# Patient Record
Sex: Male | Born: 1975 | Race: White | Hispanic: No | Marital: Married | Smoking: Former smoker
Health system: Southern US, Community
[De-identification: ages and names within clinical notes are randomized; demographics above are authoritative.]

## PROBLEM LIST (undated history)

## (undated) DIAGNOSIS — J449 Chronic obstructive pulmonary disease, unspecified: Secondary | ICD-10-CM

## (undated) DIAGNOSIS — F4310 Post-traumatic stress disorder, unspecified: Secondary | ICD-10-CM

---

## 2000-02-15 ENCOUNTER — Emergency Department (HOSPITAL_COMMUNITY): Admission: EM | Admit: 2000-02-15 | Discharge: 2000-02-15 | Payer: Self-pay | Admitting: Emergency Medicine

## 2005-02-19 DIAGNOSIS — J9383 Other pneumothorax: Secondary | ICD-10-CM

## 2005-02-19 HISTORY — DX: Other pneumothorax: J93.83

## 2005-04-04 ENCOUNTER — Inpatient Hospital Stay (HOSPITAL_COMMUNITY): Admission: EM | Admit: 2005-04-04 | Discharge: 2005-04-10 | Payer: Self-pay | Admitting: Emergency Medicine

## 2005-04-21 ENCOUNTER — Encounter: Admission: RE | Admit: 2005-04-21 | Discharge: 2005-04-21 | Payer: Self-pay | Admitting: Thoracic Surgery

## 2007-09-21 ENCOUNTER — Emergency Department (HOSPITAL_COMMUNITY): Admission: EM | Admit: 2007-09-21 | Discharge: 2007-09-21 | Payer: Self-pay | Admitting: Emergency Medicine

## 2010-12-12 ENCOUNTER — Encounter: Payer: Self-pay | Admitting: Thoracic Surgery
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
## 2019-09-13 ENCOUNTER — Other Ambulatory Visit: Payer: Self-pay

## 2019-09-13 DIAGNOSIS — Z20822 Contact with and (suspected) exposure to covid-19: Secondary | ICD-10-CM

## 2019-09-14 LAB — NOVEL CORONAVIRUS, NAA: SARS-CoV-2, NAA: NOT DETECTED

## 2020-08-14 ENCOUNTER — Other Ambulatory Visit: Payer: Self-pay

## 2020-08-14 DIAGNOSIS — Z20822 Contact with and (suspected) exposure to covid-19: Secondary | ICD-10-CM

## 2020-08-16 LAB — NOVEL CORONAVIRUS, NAA: SARS-CoV-2, NAA: NOT DETECTED

## 2020-08-16 LAB — SARS-COV-2, NAA 2 DAY TAT

## 2020-11-03 DIAGNOSIS — Z23 Encounter for immunization: Secondary | ICD-10-CM | POA: Diagnosis not present

## 2021-10-24 ENCOUNTER — Observation Stay (HOSPITAL_COMMUNITY)
Admission: EM | Admit: 2021-10-24 | Discharge: 2021-10-25 | Disposition: A | Discharge status: Home / Self Care | Payer: BC Managed Care – PPO | Attending: General Surgery | Admitting: General Surgery

## 2021-10-24 ENCOUNTER — Inpatient Hospital Stay (HOSPITAL_COMMUNITY): Payer: BC Managed Care – PPO

## 2021-10-24 ENCOUNTER — Other Ambulatory Visit: Payer: Self-pay

## 2021-10-24 ENCOUNTER — Encounter (HOSPITAL_COMMUNITY): Payer: Self-pay | Admitting: Surgery

## 2021-10-24 ENCOUNTER — Emergency Department (HOSPITAL_COMMUNITY): Payer: BC Managed Care – PPO

## 2021-10-24 DIAGNOSIS — M47812 Spondylosis without myelopathy or radiculopathy, cervical region: Secondary | ICD-10-CM | POA: Diagnosis not present

## 2021-10-24 DIAGNOSIS — R001 Bradycardia, unspecified: Secondary | ICD-10-CM | POA: Insufficient documentation

## 2021-10-24 DIAGNOSIS — S82892C Other fracture of left lower leg, initial encounter for open fracture type IIIA, IIIB, or IIIC: Secondary | ICD-10-CM | POA: Diagnosis not present

## 2021-10-24 DIAGNOSIS — J9811 Atelectasis: Secondary | ICD-10-CM | POA: Diagnosis not present

## 2021-10-24 DIAGNOSIS — S82452D Displaced comminuted fracture of shaft of left fibula, subsequent encounter for closed fracture with routine healing: Secondary | ICD-10-CM | POA: Diagnosis not present

## 2021-10-24 DIAGNOSIS — Z9889 Other specified postprocedural states: Secondary | ICD-10-CM

## 2021-10-24 DIAGNOSIS — S82852A Displaced trimalleolar fracture of left lower leg, initial encounter for closed fracture: Secondary | ICD-10-CM | POA: Diagnosis not present

## 2021-10-24 DIAGNOSIS — S99912A Unspecified injury of left ankle, initial encounter: Secondary | ICD-10-CM | POA: Diagnosis not present

## 2021-10-24 DIAGNOSIS — S82202B Unspecified fracture of shaft of left tibia, initial encounter for open fracture type I or II: Secondary | ICD-10-CM | POA: Diagnosis not present

## 2021-10-24 DIAGNOSIS — S3282XA Multiple fractures of pelvis without disruption of pelvic ring, initial encounter for closed fracture: Secondary | ICD-10-CM | POA: Diagnosis not present

## 2021-10-24 DIAGNOSIS — S82892A Other fracture of left lower leg, initial encounter for closed fracture: Secondary | ICD-10-CM | POA: Diagnosis not present

## 2021-10-24 DIAGNOSIS — I959 Hypotension, unspecified: Secondary | ICD-10-CM | POA: Diagnosis not present

## 2021-10-24 DIAGNOSIS — Z041 Encounter for examination and observation following transport accident: Secondary | ICD-10-CM | POA: Diagnosis not present

## 2021-10-24 DIAGNOSIS — Z23 Encounter for immunization: Secondary | ICD-10-CM | POA: Insufficient documentation

## 2021-10-24 DIAGNOSIS — Z20822 Contact with and (suspected) exposure to covid-19: Secondary | ICD-10-CM | POA: Diagnosis not present

## 2021-10-24 DIAGNOSIS — M25551 Pain in right hip: Secondary | ICD-10-CM | POA: Insufficient documentation

## 2021-10-24 DIAGNOSIS — T07XXXA Unspecified multiple injuries, initial encounter: Secondary | ICD-10-CM | POA: Diagnosis not present

## 2021-10-24 DIAGNOSIS — S82892B Other fracture of left lower leg, initial encounter for open fracture type I or II: Secondary | ICD-10-CM | POA: Diagnosis not present

## 2021-10-24 DIAGNOSIS — Z87891 Personal history of nicotine dependence: Secondary | ICD-10-CM | POA: Insufficient documentation

## 2021-10-24 DIAGNOSIS — S82842C Displaced bimalleolar fracture of left lower leg, initial encounter for open fracture type IIIA, IIIB, or IIIC: Secondary | ICD-10-CM | POA: Diagnosis not present

## 2021-10-24 DIAGNOSIS — S0181XA Laceration without foreign body of other part of head, initial encounter: Secondary | ICD-10-CM | POA: Insufficient documentation

## 2021-10-24 DIAGNOSIS — S3991XA Unspecified injury of abdomen, initial encounter: Secondary | ICD-10-CM | POA: Diagnosis not present

## 2021-10-24 DIAGNOSIS — S82452A Displaced comminuted fracture of shaft of left fibula, initial encounter for closed fracture: Secondary | ICD-10-CM | POA: Diagnosis not present

## 2021-10-24 DIAGNOSIS — S82432A Displaced oblique fracture of shaft of left fibula, initial encounter for closed fracture: Secondary | ICD-10-CM | POA: Diagnosis not present

## 2021-10-24 DIAGNOSIS — S9305XA Dislocation of left ankle joint, initial encounter: Secondary | ICD-10-CM | POA: Diagnosis not present

## 2021-10-24 DIAGNOSIS — R519 Headache, unspecified: Secondary | ICD-10-CM | POA: Diagnosis not present

## 2021-10-24 DIAGNOSIS — S0993XA Unspecified injury of face, initial encounter: Secondary | ICD-10-CM

## 2021-10-24 DIAGNOSIS — S82402B Unspecified fracture of shaft of left fibula, initial encounter for open fracture type I or II: Secondary | ICD-10-CM | POA: Diagnosis not present

## 2021-10-24 DIAGNOSIS — S0990XA Unspecified injury of head, initial encounter: Secondary | ICD-10-CM | POA: Diagnosis not present

## 2021-10-24 DIAGNOSIS — T1490XA Injury, unspecified, initial encounter: Secondary | ICD-10-CM

## 2021-10-24 DIAGNOSIS — J439 Emphysema, unspecified: Secondary | ICD-10-CM | POA: Diagnosis not present

## 2021-10-24 DIAGNOSIS — Z01818 Encounter for other preprocedural examination: Secondary | ICD-10-CM | POA: Diagnosis not present

## 2021-10-24 DIAGNOSIS — S82832A Other fracture of upper and lower end of left fibula, initial encounter for closed fracture: Secondary | ICD-10-CM | POA: Diagnosis not present

## 2021-10-24 LAB — URINALYSIS, ROUTINE W REFLEX MICROSCOPIC
Bilirubin Urine: NEGATIVE
Glucose, UA: NEGATIVE mg/dL
Hgb urine dipstick: NEGATIVE
Ketones, ur: NEGATIVE mg/dL
Leukocytes,Ua: NEGATIVE
Nitrite: NEGATIVE
Protein, ur: NEGATIVE mg/dL
Specific Gravity, Urine: 1.045 — ABNORMAL HIGH (ref 1.005–1.030)
pH: 5 (ref 5.0–8.0)

## 2021-10-24 LAB — SAMPLE TO BLOOD BANK

## 2021-10-24 LAB — I-STAT CHEM 8, ED
BUN: 15 mg/dL (ref 6–20)
Calcium, Ion: 1.14 mmol/L — ABNORMAL LOW (ref 1.15–1.40)
Chloride: 101 mmol/L (ref 98–111)
Creatinine, Ser: 1.2 mg/dL (ref 0.61–1.24)
Glucose, Bld: 121 mg/dL — ABNORMAL HIGH (ref 70–99)
HCT: 43 % (ref 39.0–52.0)
Hemoglobin: 14.6 g/dL (ref 13.0–17.0)
Potassium: 3.7 mmol/L (ref 3.5–5.1)
Sodium: 140 mmol/L (ref 135–145)
TCO2: 29 mmol/L (ref 22–32)

## 2021-10-24 LAB — RESP PANEL BY RT-PCR (FLU A&B, COVID) ARPGX2
Influenza A by PCR: POSITIVE — AB
Influenza B by PCR: NEGATIVE
SARS Coronavirus 2 by RT PCR: NEGATIVE

## 2021-10-24 LAB — COMPREHENSIVE METABOLIC PANEL
ALT: 17 U/L (ref 0–44)
AST: 19 U/L (ref 15–41)
Albumin: 4 g/dL (ref 3.5–5.0)
Alkaline Phosphatase: 52 U/L (ref 38–126)
Anion gap: 7 (ref 5–15)
BUN: 13 mg/dL (ref 6–20)
CO2: 28 mmol/L (ref 22–32)
Calcium: 8.9 mg/dL (ref 8.9–10.3)
Chloride: 104 mmol/L (ref 98–111)
Creatinine, Ser: 1.12 mg/dL (ref 0.61–1.24)
GFR, Estimated: >60 mL/min (ref >60)
Glucose, Bld: 130 mg/dL — ABNORMAL HIGH (ref 70–99)
Potassium: 3.7 mmol/L (ref 3.5–5.1)
Sodium: 139 mmol/L (ref 135–145)
Total Bilirubin: 0.8 mg/dL (ref 0.3–1.2)
Total Protein: 6.8 g/dL (ref 6.5–8.1)

## 2021-10-24 LAB — CBC
HCT: 43.5 % (ref 39.0–52.0)
Hemoglobin: 14.5 g/dL (ref 13.0–17.0)
MCH: 31.9 pg (ref 26.0–34.0)
MCHC: 33.3 g/dL (ref 30.0–36.0)
MCV: 95.8 fL (ref 80.0–100.0)
Platelets: 239 10*3/uL (ref 150–400)
RBC: 4.54 MIL/uL (ref 4.22–5.81)
RDW: 11.5 % (ref 11.5–15.5)
WBC: 8.2 10*3/uL (ref 4.0–10.5)
nRBC: 0 % (ref 0.0–0.2)

## 2021-10-24 LAB — HIV ANTIBODY (ROUTINE TESTING W REFLEX): HIV Screen 4th Generation wRfx: NONREACTIVE

## 2021-10-24 LAB — PROTIME-INR
INR: 1 (ref 0.8–1.2)
Prothrombin Time: 13.2 seconds (ref 11.4–15.2)

## 2021-10-24 LAB — LACTIC ACID, PLASMA: Lactic Acid, Venous: 1.7 mmol/L (ref 0.5–1.9)

## 2021-10-24 LAB — ETHANOL: Alcohol, Ethyl (B): <10 mg/dL (ref <10)

## 2021-10-24 MED ORDER — LACTATED RINGERS IV SOLN: INTRAVENOUS | Status: DC

## 2021-10-24 MED ORDER — DOCUSATE SODIUM 100 MG PO CAPS
100.0000 mg | ORAL_CAPSULE | Freq: Two times a day (BID) | ORAL | Status: DC
  Administered 2021-10-24: 22:00:00 100 mg via ORAL
  Filled 2021-10-24: qty 1

## 2021-10-24 MED ORDER — PROPOFOL 10 MG/ML IV BOLUS
INTRAVENOUS | Status: AC | PRN
  Administered 2021-10-24: 60 mg via INTRAVENOUS

## 2021-10-24 MED ORDER — ONDANSETRON 4 MG PO TBDP: 4.0000 mg | ORAL_TABLET | Freq: Four times a day (QID) | ORAL | Status: DC | PRN

## 2021-10-24 MED ORDER — SODIUM CHLORIDE 0.9 % IV SOLN: INTRAVENOUS | Status: DC

## 2021-10-24 MED ORDER — LIDOCAINE HCL 2 % IJ SOLN: 10.0000 mL | Freq: Once | INTRAMUSCULAR | Status: DC

## 2021-10-24 MED ORDER — LIDOCAINE HCL (PF) 2 % IJ SOLN: 10.0000 mL | Freq: Once | INTRAMUSCULAR | Status: DC

## 2021-10-24 MED ORDER — FENTANYL CITRATE PF 50 MCG/ML IJ SOSY: 50.0000 ug | PREFILLED_SYRINGE | Freq: Once | INTRAMUSCULAR | Status: DC

## 2021-10-24 MED ORDER — ACETAMINOPHEN 325 MG PO TABS
650.0000 mg | ORAL_TABLET | Freq: Four times a day (QID) | ORAL | Status: DC
  Administered 2021-10-24 – 2021-10-25 (×3): 650 mg via ORAL
  Filled 2021-10-24 (×3): qty 2

## 2021-10-24 MED ORDER — LIDOCAINE HCL (CARDIAC) PF 100 MG/5ML IV SOSY
PREFILLED_SYRINGE | INTRAVENOUS | Status: AC
  Filled 2021-10-24: qty 5

## 2021-10-24 MED ORDER — METHOCARBAMOL 500 MG PO TABS
500.0000 mg | ORAL_TABLET | Freq: Four times a day (QID) | ORAL | Status: DC
  Administered 2021-10-24 – 2021-10-25 (×3): 500 mg via ORAL
  Filled 2021-10-24 (×3): qty 1

## 2021-10-24 MED ORDER — HYDROMORPHONE HCL 1 MG/ML IJ SOLN
0.5000 mg | INTRAMUSCULAR | Status: DC | PRN
  Administered 2021-10-24 – 2021-10-25 (×4): 0.5 mg via INTRAVENOUS
  Filled 2021-10-24 (×4): qty 0.5

## 2021-10-24 MED ORDER — IOHEXOL 300 MG/ML  SOLN
100.0000 mL | Freq: Once | INTRAMUSCULAR | Status: AC | PRN
  Administered 2021-10-24: 18:00:00 100 mL via INTRAVENOUS

## 2021-10-24 MED ORDER — TETANUS-DIPHTH-ACELL PERTUSSIS 5-2.5-18.5 LF-MCG/0.5 IM SUSY
0.5000 mL | PREFILLED_SYRINGE | Freq: Once | INTRAMUSCULAR | Status: AC
  Administered 2021-10-24: 17:00:00 0.5 mL via INTRAMUSCULAR
  Filled 2021-10-24: qty 0.5

## 2021-10-24 MED ORDER — ENOXAPARIN SODIUM 30 MG/0.3ML IJ SOSY: 30.0000 mg | PREFILLED_SYRINGE | Freq: Two times a day (BID) | INTRAMUSCULAR | Status: DC

## 2021-10-24 MED ORDER — CEFAZOLIN SODIUM-DEXTROSE 2-4 GM/100ML-% IV SOLN
2.0000 g | Freq: Once | INTRAVENOUS | Status: AC
  Administered 2021-10-24: 17:00:00 2 g via INTRAVENOUS
  Filled 2021-10-24: qty 100

## 2021-10-24 MED ORDER — OSELTAMIVIR PHOSPHATE 75 MG PO CAPS
75.0000 mg | ORAL_CAPSULE | Freq: Two times a day (BID) | ORAL | Status: DC
  Administered 2021-10-24 – 2021-10-25 (×2): 75 mg via ORAL
  Filled 2021-10-24 (×3): qty 1

## 2021-10-24 MED ORDER — SODIUM CHLORIDE 0.9 % IV BOLUS
1000.0000 mL | Freq: Once | INTRAVENOUS | Status: AC
  Administered 2021-10-24: 17:00:00 1000 mL via INTRAVENOUS

## 2021-10-24 MED ORDER — OXYCODONE HCL 5 MG PO TABS
5.0000 mg | ORAL_TABLET | ORAL | Status: DC | PRN
  Administered 2021-10-24 – 2021-10-25 (×4): 5 mg via ORAL
  Filled 2021-10-24 (×4): qty 1

## 2021-10-24 MED ORDER — PROPOFOL 10 MG/ML IV BOLUS
60.0000 mg | Freq: Once | INTRAVENOUS | Status: DC
  Filled 2021-10-24: qty 20

## 2021-10-24 MED ORDER — FENTANYL CITRATE PF 50 MCG/ML IJ SOSY
100.0000 ug | PREFILLED_SYRINGE | INTRAMUSCULAR | Status: DC | PRN
  Administered 2021-10-24: 17:00:00 100 ug via INTRAVENOUS
  Filled 2021-10-24: qty 2

## 2021-10-24 MED ORDER — ONDANSETRON HCL 4 MG/2ML IJ SOLN: 4.0000 mg | Freq: Four times a day (QID) | INTRAMUSCULAR | Status: DC | PRN

## 2021-10-24 MED ORDER — LIDOCAINE HCL (PF) 1 % IJ SOLN
INTRAMUSCULAR | Status: AC
  Filled 2021-10-24: qty 30
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
## 2021-10-24 NOTE — Sedation Documentation (Signed)
Consent signed by pt. Consent at bedside.

## 2021-10-24 NOTE — ED Notes (Signed)
Belongings bagged and labeled, pt using his cell phone. Feet elevated above heart. Pillow given.

## 2021-10-24 NOTE — Sedation Documentation (Signed)
Ortho and RT at bedside. Pt placed on 2L via [place] with ETCO2 monitor in place.

## 2021-10-24 NOTE — ED Notes (Signed)
Patient transported to CT 
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
## 2021-10-24 NOTE — ED Notes (Signed)
Pt transported to CT with Clydie Braun, [employer] CT delayed per Jeraldine Loots, MD due to priority for conscious sedation.

## 2021-10-24 NOTE — Progress Notes (Signed)
Orthopedic Tech Progress Note Patient Details:  Jake Morgan 08-Aug-1976 [id number]  Ortho Devices Type of Ortho Device: Post (short leg) splint, Stirrup splint Ortho Device/Splint Location: plaster. lle Ortho Device/Splint Interventions: Ordered, Application, Adjustment  I assisted ortho dr with splint application post reduction. Post Interventions Patient Tolerated: Well Instructions Provided: Care of device, Adjustment of device  [name] 10/24/2021, 8:03 PM
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
## 2021-10-25 ENCOUNTER — Other Ambulatory Visit (HOSPITAL_COMMUNITY): Payer: Self-pay

## 2021-10-25 DIAGNOSIS — S82202B Unspecified fracture of shaft of left tibia, initial encounter for open fracture type I or II: Secondary | ICD-10-CM | POA: Diagnosis not present

## 2021-10-25 DIAGNOSIS — S82402B Unspecified fracture of shaft of left fibula, initial encounter for open fracture type I or II: Secondary | ICD-10-CM | POA: Diagnosis not present

## 2021-10-25 MED ORDER — POLYETHYLENE GLYCOL 3350 17 G PO PACK: 17.0000 g | PACK | Freq: Every day | ORAL | 0 refills | Status: DC | PRN

## 2021-10-25 MED ORDER — ENOXAPARIN SODIUM 30 MG/0.3ML IJ SOSY
30.0000 mg | PREFILLED_SYRINGE | Freq: Two times a day (BID) | INTRAMUSCULAR | 0 refills | Status: DC
Start: 2021-10-25 — End: 2021-11-04
  Filled 2021-10-25: qty 8.4, 14d supply, fill #0

## 2021-10-25 MED ORDER — METHOCARBAMOL 500 MG PO TABS
500.0000 mg | ORAL_TABLET | Freq: Four times a day (QID) | ORAL | 0 refills | Status: DC | PRN
  Filled 2021-10-25: qty 30, 8d supply, fill #0

## 2021-10-25 MED ORDER — HYDROXYZINE HCL 25 MG PO TABS
25.0000 mg | ORAL_TABLET | Freq: Three times a day (TID) | ORAL | 0 refills | Status: DC | PRN
Start: 2021-10-25 — End: 2023-06-27
  Filled 2021-10-25: qty 20, 4d supply, fill #0

## 2021-10-25 MED ORDER — HYDROXYZINE HCL 25 MG PO TABS
25.0000 mg | ORAL_TABLET | Freq: Three times a day (TID) | ORAL | Status: DC | PRN
  Administered 2021-10-25: 50 mg via ORAL
  Filled 2021-10-25: qty 2

## 2021-10-25 MED ORDER — OSELTAMIVIR PHOSPHATE 75 MG PO CAPS
75.0000 mg | ORAL_CAPSULE | Freq: Two times a day (BID) | ORAL | 0 refills | Status: AC
  Filled 2021-10-25: qty 8, 4d supply, fill #0

## 2021-10-25 MED ORDER — OXYCODONE HCL 5 MG PO TABS
5.0000 mg | ORAL_TABLET | Freq: Four times a day (QID) | ORAL | 0 refills | Status: DC | PRN
  Filled 2021-10-25: qty 25, 7d supply, fill #0

## 2021-10-25 NOTE — Progress Notes (Signed)
     Subjective: Patient reports left ankle pain is well controlled today.  He denies distal numbness and tingling.  He denies pain in other joints or extremities.  He denies any chest pain or shortness of breath.  He is n.p.o. for possible surgery but discussion with OR, there is limited availability.   Objective:   VITALS:   Vitals:   10/24/21 2000 10/24/21 2043 10/24/21 2335 10/25/21 0404  BP: 128/87 126/75 105/60 (!) 104/55  Pulse: 71 78 68 (!) 58  Resp: 20 18 18 17   Temp:  98.9 F (37.2 C) 98.1 F (36.7 C) 98.2 F (36.8 C)  TempSrc:   Oral Oral  SpO2: 99% 100% 100% 98%  Weight:      Height:        Splint clean, dry and intact. Fires EHL, FHL. Sensation intact distally in exposed toes. Toes are warm and well perfused, no significant edema.   Lab Results  Component Value Date   WBC 8.2 10/24/2021   HGB 14.6 10/24/2021   HCT 43.0 10/24/2021   MCV 95.8 10/24/2021   PLT 239 10/24/2021   BMET    Component Value Date/Time   NA 140 10/24/2021 1703   K 3.7 10/24/2021 1703   CL 101 10/24/2021 1703   CO2 28 10/24/2021 1649   GLUCOSE 121 (H) 10/24/2021 1703   BUN 15 10/24/2021 1703   CREATININE 1.20 10/24/2021 1703   CALCIUM 8.9 10/24/2021 1649   GFRNONAA >60 10/24/2021 1649     Assessment/Plan:    Left closed trimalleolar ankle fracture.  There was concern initially for possible open fracture given abrasions over the medial mall.  Upon reassessment during repeat closed reduction the wounds were found to be superficial, without significant bleeding or full-thickness breakdown.  CT scan shows large comminuted posterior malleolar fragment.  Given complexity of the injury will defer surgical management to one of the trauma or ankle specialist.  Discussed with Dr. 14/02/2021 who is in trauma room today but there is unfortunately limited OR availability.  If unable to proceed to the OR today we will give patient a diet.  Patient expressed understanding agreement this  plan.      Vee Bahe A Rolland Steinert 10/25/2021, 8:48 AM   14/03/2021, MD  Contact information:   [phone] 7am-5pm epic message Dr. [email or web address], or call office for patient follow up: [phone] After hours and holidays please check [email or web address] for group call information for [facility]
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
## 2021-10-25 NOTE — Consult Note (Signed)
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  Cardiovascular:     Rate and Rhythm: Normal rate and regular rhythm.  Pulmonary:     Effort: Pulmonary effort is normal. No respiratory distress.  Musculoskeletal:     Cervical back: Normal range of motion.     Comments: LLE No traumatic wounds, ecchymosis, or rash  Short leg splint in place  No knee effusion  Knee stable to varus/ valgus and anterior/posterior stress  Sens DPN, SPN, TN intact  Motor EHL 5/5  Toes perfused, No significant edema  Skin:    General: Skin is warm and dry.  Neurological:     Mental Status: He is alert.  Psychiatric:        Mood and Affect: Mood normal.        Behavior: Behavior normal.    Assessment/Plan: Left ankle fx -- Will try to fix today if OR time available. Please keep NPO for now.    Freeman Caldron, PA-C Orthopedic Surgery [phone] 10/25/2021, 9:08 AM
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
## 2021-10-28 DIAGNOSIS — S82852D Displaced trimalleolar fracture of left lower leg, subsequent encounter for closed fracture with routine healing: Secondary | ICD-10-CM | POA: Diagnosis not present

## 2021-10-28 DIAGNOSIS — S0181XD Laceration without foreign body of other part of head, subsequent encounter: Secondary | ICD-10-CM | POA: Diagnosis not present

## 2021-11-03 ENCOUNTER — Encounter (HOSPITAL_COMMUNITY): Payer: Self-pay | Admitting: Orthopaedic Surgery

## 2021-11-03 ENCOUNTER — Other Ambulatory Visit: Payer: Self-pay | Admitting: Orthopaedic Surgery

## 2021-11-04 ENCOUNTER — Other Ambulatory Visit: Payer: Self-pay

## 2021-11-04 ENCOUNTER — Ambulatory Visit (HOSPITAL_COMMUNITY): Payer: BC Managed Care – PPO

## 2021-11-04 ENCOUNTER — Ambulatory Visit (HOSPITAL_COMMUNITY): Payer: BC Managed Care – PPO | Admitting: Anesthesiology

## 2021-11-04 ENCOUNTER — Ambulatory Visit (HOSPITAL_COMMUNITY)
Admission: RE | Admit: 2021-11-04 | Discharge: 2021-11-04 | Disposition: A | Discharge status: Home / Self Care | Payer: BC Managed Care – PPO | Attending: Orthopaedic Surgery | Admitting: Orthopaedic Surgery

## 2021-11-04 ENCOUNTER — Encounter (HOSPITAL_COMMUNITY): Payer: Self-pay | Admitting: Orthopaedic Surgery

## 2021-11-04 ENCOUNTER — Encounter (HOSPITAL_COMMUNITY)
Admission: RE | Disposition: A | Discharge status: Home / Self Care | Payer: Self-pay | Source: Home / Self Care | Attending: Orthopaedic Surgery

## 2021-11-04 DIAGNOSIS — Y939 Activity, unspecified: Secondary | ICD-10-CM | POA: Diagnosis not present

## 2021-11-04 DIAGNOSIS — X58XXXA Exposure to other specified factors, initial encounter: Secondary | ICD-10-CM | POA: Insufficient documentation

## 2021-11-04 DIAGNOSIS — Z419 Encounter for procedure for purposes other than remedying health state, unspecified: Secondary | ICD-10-CM

## 2021-11-04 DIAGNOSIS — Z87891 Personal history of nicotine dependence: Secondary | ICD-10-CM | POA: Diagnosis not present

## 2021-11-04 DIAGNOSIS — S93432A Sprain of tibiofibular ligament of left ankle, initial encounter: Secondary | ICD-10-CM | POA: Insufficient documentation

## 2021-11-04 DIAGNOSIS — S9305XA Dislocation of left ankle joint, initial encounter: Secondary | ICD-10-CM | POA: Diagnosis not present

## 2021-11-04 DIAGNOSIS — S82852A Displaced trimalleolar fracture of left lower leg, initial encounter for closed fracture: Secondary | ICD-10-CM | POA: Diagnosis not present

## 2021-11-04 DIAGNOSIS — G8918 Other acute postprocedural pain: Secondary | ICD-10-CM | POA: Diagnosis not present

## 2021-11-04 HISTORY — DX: Post-traumatic stress disorder, unspecified: F43.10

## 2021-11-04 HISTORY — PX: ORIF ANKLE FRACTURE: SHX5408

## 2021-11-04 LAB — SURGICAL PCR SCREEN
MRSA, PCR: NEGATIVE
Staphylococcus aureus: NEGATIVE

## 2021-11-04 SURGERY — OPEN REDUCTION INTERNAL FIXATION (ORIF) ANKLE FRACTURE
Anesthesia: Regional | Site: Ankle | Laterality: Left

## 2021-11-04 MED ORDER — PHENYLEPHRINE 40 MCG/ML (10ML) SYRINGE FOR IV PUSH (FOR BLOOD PRESSURE SUPPORT)
PREFILLED_SYRINGE | INTRAVENOUS | Status: DC | PRN
  Administered 2021-11-04: 120 ug via INTRAVENOUS

## 2021-11-04 MED ORDER — FENTANYL CITRATE (PF) 250 MCG/5ML IJ SOLN
INTRAMUSCULAR | Status: DC | PRN
  Administered 2021-11-04: 25 ug via INTRAVENOUS
  Administered 2021-11-04: 100 ug via INTRAVENOUS
  Administered 2021-11-04: 25 ug via INTRAVENOUS

## 2021-11-04 MED ORDER — MIDAZOLAM HCL 2 MG/2ML IJ SOLN
INTRAMUSCULAR | Status: AC
  Filled 2021-11-04: qty 2

## 2021-11-04 MED ORDER — PROPOFOL 10 MG/ML IV BOLUS
INTRAVENOUS | Status: DC | PRN
  Administered 2021-11-04: 200 mg via INTRAVENOUS

## 2021-11-04 MED ORDER — OXYCODONE HCL 5 MG PO TABS: 5.0000 mg | ORAL_TABLET | ORAL | 0 refills | Status: AC | PRN

## 2021-11-04 MED ORDER — DEXMEDETOMIDINE (PRECEDEX) IN NS 20 MCG/5ML (4 MCG/ML) IV SYRINGE
PREFILLED_SYRINGE | INTRAVENOUS | Status: AC
  Filled 2021-11-04: qty 5

## 2021-11-04 MED ORDER — DEXAMETHASONE SODIUM PHOSPHATE 10 MG/ML IJ SOLN
INTRAMUSCULAR | Status: DC | PRN
  Administered 2021-11-04 (×2): 5 mg

## 2021-11-04 MED ORDER — ACETAMINOPHEN 500 MG PO TABS
1000.0000 mg | ORAL_TABLET | Freq: Once | ORAL | Status: DC
  Filled 2021-11-04: qty 2

## 2021-11-04 MED ORDER — CHLORHEXIDINE GLUCONATE 4 % EX LIQD: 60.0000 mL | Freq: Once | CUTANEOUS | Status: DC

## 2021-11-04 MED ORDER — DEXMEDETOMIDINE (PRECEDEX) IN NS 20 MCG/5ML (4 MCG/ML) IV SYRINGE
PREFILLED_SYRINGE | INTRAVENOUS | Status: DC | PRN
  Administered 2021-11-04: 8 ug via INTRAVENOUS

## 2021-11-04 MED ORDER — MIDAZOLAM HCL 2 MG/2ML IJ SOLN
INTRAMUSCULAR | Status: DC | PRN
  Administered 2021-11-04: 2 mg via INTRAVENOUS

## 2021-11-04 MED ORDER — ACETAMINOPHEN 10 MG/ML IV SOLN
INTRAVENOUS | Status: DC | PRN
  Administered 2021-11-04: 1000 mg via INTRAVENOUS

## 2021-11-04 MED ORDER — DEXAMETHASONE SODIUM PHOSPHATE 10 MG/ML IJ SOLN
INTRAMUSCULAR | Status: DC | PRN
  Administered 2021-11-04: 5 mg via INTRAVENOUS

## 2021-11-04 MED ORDER — LIDOCAINE 2% (20 MG/ML) 5 ML SYRINGE
INTRAMUSCULAR | Status: DC | PRN
  Administered 2021-11-04: 20 mg via INTRAVENOUS

## 2021-11-04 MED ORDER — POVIDONE-IODINE 10 % EX SWAB
2.0000 "application " | Freq: Once | CUTANEOUS | Status: AC
  Administered 2021-11-04: 2 via TOPICAL

## 2021-11-04 MED ORDER — PROPOFOL 10 MG/ML IV BOLUS
INTRAVENOUS | Status: AC
  Filled 2021-11-04: qty 20

## 2021-11-04 MED ORDER — FENTANYL CITRATE (PF) 250 MCG/5ML IJ SOLN
INTRAMUSCULAR | Status: AC
  Filled 2021-11-04: qty 5

## 2021-11-04 MED ORDER — LIDOCAINE 2% (20 MG/ML) 5 ML SYRINGE
INTRAMUSCULAR | Status: AC
  Filled 2021-11-04: qty 5

## 2021-11-04 MED ORDER — PHENYLEPHRINE 40 MCG/ML (10ML) SYRINGE FOR IV PUSH (FOR BLOOD PRESSURE SUPPORT)
PREFILLED_SYRINGE | INTRAVENOUS | Status: AC
  Filled 2021-11-04: qty 10

## 2021-11-04 MED ORDER — 0.9 % SODIUM CHLORIDE (POUR BTL) OPTIME
TOPICAL | Status: DC | PRN
  Administered 2021-11-04: 500 mL

## 2021-11-04 MED ORDER — ONDANSETRON HCL 4 MG/2ML IJ SOLN
INTRAMUSCULAR | Status: DC | PRN
  Administered 2021-11-04: 4 mg via INTRAVENOUS

## 2021-11-04 MED ORDER — ONDANSETRON HCL 4 MG/2ML IJ SOLN
INTRAMUSCULAR | Status: AC
  Filled 2021-11-04: qty 2

## 2021-11-04 MED ORDER — CEFAZOLIN SODIUM-DEXTROSE 2-4 GM/100ML-% IV SOLN
2.0000 g | INTRAVENOUS | Status: AC
  Administered 2021-11-04: 2 g via INTRAVENOUS
  Filled 2021-11-04: qty 100

## 2021-11-04 MED ORDER — DEXAMETHASONE SODIUM PHOSPHATE 10 MG/ML IJ SOLN
INTRAMUSCULAR | Status: AC
  Filled 2021-11-04: qty 1

## 2021-11-04 MED ORDER — ACETAMINOPHEN 10 MG/ML IV SOLN
INTRAVENOUS | Status: AC
  Filled 2021-11-04: qty 100

## 2021-11-04 MED ORDER — EPHEDRINE SULFATE-NACL 50-0.9 MG/10ML-% IV SOSY
PREFILLED_SYRINGE | INTRAVENOUS | Status: DC | PRN
  Administered 2021-11-04: 5 mg via INTRAVENOUS

## 2021-11-04 MED ORDER — ROPIVACAINE HCL 5 MG/ML IJ SOLN
INTRAMUSCULAR | Status: DC | PRN
  Administered 2021-11-04: 15 mL via PERINEURAL
  Administered 2021-11-04: 30 mL via PERINEURAL

## 2021-11-04 MED ORDER — ORAL CARE MOUTH RINSE: 15.0000 mL | Freq: Once | OROMUCOSAL | Status: AC

## 2021-11-04 MED ORDER — CHLORHEXIDINE GLUCONATE 0.12 % MT SOLN
15.0000 mL | Freq: Once | OROMUCOSAL | Status: AC
  Administered 2021-11-04: 15 mL via OROMUCOSAL
  Filled 2021-11-04: qty 15

## 2021-11-04 MED ORDER — LACTATED RINGERS IV SOLN: INTRAVENOUS | Status: DC

## 2021-11-04 MED ORDER — EPHEDRINE 5 MG/ML INJ
INTRAVENOUS | Status: AC
  Filled 2021-11-04: qty 5

## 2021-11-04 SURGICAL SUPPLY — 71 items
ALCOHOL 70% 16 OZ (MISCELLANEOUS) ×2 IMPLANT
APL PRP STRL LF DISP 70% ISPRP (MISCELLANEOUS) ×2
BAG COUNTER SPONGE SURGICOUNT (BAG) ×1 IMPLANT
BAG SPNG CNTER NS LX DISP (BAG)
BANDAGE ESMARK 6X9 LF (GAUZE/BANDAGES/DRESSINGS) IMPLANT
BIT DRILL 2.5 CANN LNG (BIT) ×1 IMPLANT
BIT DRILL 2.6 CANN (BIT) ×2 IMPLANT
BLADE SURG 15 STRL LF DISP TIS (BLADE) ×1 IMPLANT
BLADE SURG 15 STRL SS (BLADE) ×2
BNDG CMPR 9X6 STRL LF SNTH (GAUZE/BANDAGES/DRESSINGS)
BNDG CMPR MED 10X6 ELC LF (GAUZE/BANDAGES/DRESSINGS)
BNDG COHESIVE 4X5 TAN STRL (GAUZE/BANDAGES/DRESSINGS) IMPLANT
BNDG COHESIVE 6X5 TAN STRL LF (GAUZE/BANDAGES/DRESSINGS) IMPLANT
BNDG ELASTIC 4X5.8 VLCR STR LF (GAUZE/BANDAGES/DRESSINGS) ×1 IMPLANT
BNDG ELASTIC 6X10 VLCR STRL LF (GAUZE/BANDAGES/DRESSINGS) ×1 IMPLANT
BNDG ELASTIC 6X5.8 VLCR STR LF (GAUZE/BANDAGES/DRESSINGS) ×1 IMPLANT
BNDG ESMARK 6X9 LF (GAUZE/BANDAGES/DRESSINGS)
CANISTER SUCT 3000ML PPV (MISCELLANEOUS) ×2 IMPLANT
CHLORAPREP W/TINT 26 (MISCELLANEOUS) ×4 IMPLANT
COVER SURGICAL LIGHT HANDLE (MISCELLANEOUS) ×2 IMPLANT
CUFF TOURN SGL QUICK 34 (TOURNIQUET CUFF) ×2
CUFF TOURN SGL QUICK 42 (TOURNIQUET CUFF) IMPLANT
CUFF TRNQT CYL 34X4.125X (TOURNIQUET CUFF) ×1 IMPLANT
DRAPE C-ARM 42X72 X-RAY (DRAPES) ×1 IMPLANT
DRAPE C-ARMOR (DRAPES) ×1 IMPLANT
DRAPE OEC MINIVIEW 54X84 (DRAPES) ×1 IMPLANT
DRAPE U-SHAPE 47X51 STRL (DRAPES) ×2 IMPLANT
DRSG MEPITEL 4X7.2 (GAUZE/BANDAGES/DRESSINGS) ×1 IMPLANT
DRSG PAD ABDOMINAL 8X10 ST (GAUZE/BANDAGES/DRESSINGS) ×2 IMPLANT
DRSG XEROFORM 1X8 (GAUZE/BANDAGES/DRESSINGS) ×2 IMPLANT
ELECT REM PT RETURN 9FT ADLT (ELECTROSURGICAL) ×2
ELECTRODE REM PT RTRN 9FT ADLT (ELECTROSURGICAL) ×1 IMPLANT
GAUZE SPONGE 4X4 12PLY STRL (GAUZE/BANDAGES/DRESSINGS) IMPLANT
GAUZE SPONGE 4X4 12PLY STRL LF (GAUZE/BANDAGES/DRESSINGS) ×2 IMPLANT
GLOVE SRG 8 PF TXTR STRL LF DI (GLOVE) ×1 IMPLANT
GLOVE SURG ENC TEXT LTX SZ7.5 (GLOVE) ×4 IMPLANT
GLOVE SURG UNDER POLY LF SZ8 (GLOVE) ×4
GOWN STRL REUS W/ TWL LRG LVL3 (GOWN DISPOSABLE) ×1 IMPLANT
GOWN STRL REUS W/ TWL XL LVL3 (GOWN DISPOSABLE) ×1 IMPLANT
GOWN STRL REUS W/TWL LRG LVL3 (GOWN DISPOSABLE) ×2
GOWN STRL REUS W/TWL XL LVL3 (GOWN DISPOSABLE) ×4
GUIDEWIRE 1.35MM (WIRE) ×4 IMPLANT
KIT BASIN OR (CUSTOM PROCEDURE TRAY) ×2 IMPLANT
KIT TURNOVER KIT B (KITS) ×2 IMPLANT
NS IRRIG 1000ML POUR BTL (IV SOLUTION) ×2 IMPLANT
PACK ORTHO EXTREMITY (CUSTOM PROCEDURE TRAY) ×2 IMPLANT
PAD ARMBOARD 7.5X6 YLW CONV (MISCELLANEOUS) ×4 IMPLANT
PAD CAST 4YDX4 CTTN HI CHSV (CAST SUPPLIES) ×1 IMPLANT
PADDING CAST COTTON 4X4 STRL (CAST SUPPLIES) ×2
PLATE LOCK THIRD 150 12H (Plate) ×1 IMPLANT
SCREW CANC TI FT ANKLE 4X18 (Screw) ×1 IMPLANT
SCREW LO-PRO TI 3.5X16MM (Screw) ×3 IMPLANT
SCREW LP TI 3.5X14MM (Screw) ×2 IMPLANT
SCREW QCFIX CANN 4X46 SHT (Screw) ×1 IMPLANT
SCREW QCKFIX CANN 4.0X40MM (Screw) ×2 IMPLANT
SPLINT PLASTER CAST XFAST 5X30 (CAST SUPPLIES) IMPLANT
SPLINT PLASTER XFAST SET 5X30 (CAST SUPPLIES) ×1
SPONGE T-LAP 18X18 ~~LOC~~+RFID (SPONGE) ×2 IMPLANT
STAPLER VISISTAT 35W (STAPLE) ×2 IMPLANT
SUCTION FRAZIER HANDLE 10FR (MISCELLANEOUS) ×2
SUCTION TUBE FRAZIER 10FR DISP (MISCELLANEOUS) ×1 IMPLANT
SUT ETHILON 3 0 PS 1 (SUTURE) ×1 IMPLANT
SUT MNCRL AB 3-0 PS2 18 (SUTURE) ×1 IMPLANT
SUT MON AB 3-0 SH 27 (SUTURE) ×4
SUT MON AB 3-0 SH27 (SUTURE) ×1 IMPLANT
SUT VIC AB 2-0 CT1 27 (SUTURE) ×4
SUT VIC AB 2-0 CT1 TAPERPNT 27 (SUTURE) ×2 IMPLANT
SYNDESMOSIS TIGHTROPE XP (Orthopedic Implant) ×1 IMPLANT
TOWEL GREEN STERILE (TOWEL DISPOSABLE) ×2 IMPLANT
TOWEL GREEN STERILE FF (TOWEL DISPOSABLE) ×2 IMPLANT
TUBE CONNECTING 12X1/4 (SUCTIONS) ×2 IMPLANT

## 2021-11-04 NOTE — Anesthesia Procedure Notes (Signed)
Procedure Name: LMA Insertion Date/Time: 11/04/2021 7:50 AM Performed by: Laruth Bouchard., CRNA Pre-anesthesia Checklist: Patient identified, Emergency Drugs available, Suction available, Patient being monitored and Timeout performed Patient Re-evaluated:Patient Re-evaluated prior to induction Oxygen Delivery Method: Circle system utilized Preoxygenation: Pre-oxygenation with 100% oxygen Induction Type: IV induction LMA: LMA inserted LMA Size: 4.0 Number of attempts: 1 Placement Confirmation: positive ETCO2 and breath sounds checked- equal and bilateral Tube secured with: Tape Dental Injury: Teeth and Oropharynx as per pre-operative assessment

## 2021-11-04 NOTE — Anesthesia Postprocedure Evaluation (Signed)
Anesthesia Post Note  Patient: Gehrig Patras  Procedure(s) Performed: OPEN REDUCTION TRIMALLEOLAR ANKLE FRACTURE WITH POSTERIOR FIXATION, SYNDESMOSIS (Left: Ankle)     Patient location during evaluation: PACU Anesthesia Type: Regional and General Level of consciousness: awake and alert Pain management: pain level controlled Vital Signs Assessment: post-procedure vital signs reviewed and stable Respiratory status: spontaneous breathing, nonlabored ventilation, respiratory function stable and patient connected to nasal cannula oxygen Cardiovascular status: blood pressure returned to baseline and stable Postop Assessment: no apparent nausea or vomiting Anesthetic complications: no   No notable events documented.  Last Vitals:  Vitals:   11/04/21 1023 11/04/21 1038  BP: 115/76 114/81  Pulse: 62 81  Resp: 13 15  Temp:  36.6 C  SpO2: 100% 100%    Last Pain:  Vitals:   11/04/21 1038  TempSrc:   PainSc: 0-No pain                 Levent Kornegay L Daivion Pape

## 2021-11-04 NOTE — Transfer of Care (Signed)
Immediate Anesthesia Transfer of Care Note  Patient: Jake Morgan  Procedure(s) Performed: OPEN REDUCTION TRIMALLEOLAR ANKLE FRACTURE WITH POSTERIOR FIXATION, SYNDESMOSIS (Left: Ankle)  Patient Location: PACU  Anesthesia Type:General and Regional  Level of Consciousness: awake, alert  and oriented  Airway & Oxygen Therapy: Patient Spontanous Breathing and Patient connected to nasal cannula oxygen  Post-op Assessment: Report given to RN and Post -op Vital signs reviewed and stable  Post vital signs: Reviewed and stable  Last Vitals:  Vitals Value Taken Time  BP 134/82 11/04/21 0952  Temp    Pulse 100 11/04/21 0953  Resp 11 11/04/21 0953  SpO2 99 % 11/04/21 0953  Vitals shown include unvalidated device data.  Last Pain:  Vitals:   11/04/21 0554  TempSrc: Oral  PainSc:          Complications: No notable events documented.
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
## 2021-11-04 NOTE — Anesthesia Procedure Notes (Signed)
Anesthesia Regional Block: Adductor canal block   Pre-Anesthetic Checklist: , timeout performed,  Correct Patient, Correct Site, Correct Laterality,  Correct Procedure, Correct Position, site marked,  Risks and benefits discussed,  Surgical consent,  Pre-op evaluation,  At surgeon's request and post-op pain management  Laterality: Left  Prep: Maximum Sterile Barrier Precautions used, chloraprep       Needles:  Injection technique: Single-shot  Needle Type: Echogenic Stimulator Needle     Needle Length: 9cm  Needle Gauge: 22     Additional Needles:   Procedures:,,,, ultrasound used (permanent image in chart),,    Narrative:  Start time: 11/04/2021 7:28 AM End time: 11/04/2021 7:30 AM Injection made incrementally with aspirations every 5 mL.  Performed by: Personally  Anesthesiologist: Elmer Picker, MD  Additional Notes: Monitors applied. No increased pain on injection. No increased resistance to injection. Injection made in 5cc increments. Good needle visualization. Patient tolerated procedure well.

## 2021-11-04 NOTE — Discharge Instructions (Signed)
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
## 2021-11-04 NOTE — Anesthesia Procedure Notes (Signed)
Anesthesia Regional Block: Popliteal block   Pre-Anesthetic Checklist: , timeout performed,  Correct Patient, Correct Site, Correct Laterality,  Correct Procedure, Correct Position, site marked,  Risks and benefits discussed,  Surgical consent,  Pre-op evaluation,  At surgeon's request and post-op pain management  Laterality: Left  Prep: Maximum Sterile Barrier Precautions used, chloraprep       Needles:  Injection technique: Single-shot  Needle Type: Echogenic Stimulator Needle     Needle Length: 9cm  Needle Gauge: 22     Additional Needles:   Procedures:,,,, ultrasound used (permanent image in chart),,    Narrative:  Start time: 11/04/2021 7:22 AM End time: 11/04/2021 7:25 AM Injection made incrementally with aspirations every 5 mL.  Performed by: Personally  Anesthesiologist: Elmer Picker, MD  Additional Notes: Monitors applied. No increased pain on injection. No increased resistance to injection. Injection made in 5cc increments. Good needle visualization. Patient tolerated procedure well.

## 2021-11-05 ENCOUNTER — Other Ambulatory Visit (HOSPITAL_COMMUNITY): Payer: Self-pay

## 2021-11-05 ENCOUNTER — Encounter (HOSPITAL_COMMUNITY): Payer: Self-pay | Admitting: Orthopaedic Surgery
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
## 2021-11-19 DIAGNOSIS — Z9889 Other specified postprocedural states: Secondary | ICD-10-CM | POA: Diagnosis not present

## 2021-11-19 DIAGNOSIS — S82852D Displaced trimalleolar fracture of left lower leg, subsequent encounter for closed fracture with routine healing: Secondary | ICD-10-CM | POA: Diagnosis not present

## 2021-12-20 DIAGNOSIS — Z9889 Other specified postprocedural states: Secondary | ICD-10-CM | POA: Diagnosis not present

## 2021-12-20 DIAGNOSIS — S82852D Displaced trimalleolar fracture of left lower leg, subsequent encounter for closed fracture with routine healing: Secondary | ICD-10-CM | POA: Diagnosis not present

## 2022-01-24 DIAGNOSIS — S82852D Displaced trimalleolar fracture of left lower leg, subsequent encounter for closed fracture with routine healing: Secondary | ICD-10-CM | POA: Diagnosis not present

## 2022-01-24 DIAGNOSIS — Z9889 Other specified postprocedural states: Secondary | ICD-10-CM | POA: Diagnosis not present

## 2022-03-18 IMAGING — CT CT ANKLE*L* W/O CM
3 series · 13 of 35 positions shown, 16 images · non-contrast
Comparison: None.

CLINICAL DATA: Left ankle fracture, preoperative planning

EXAM:
CT OF THE LEFT ANKLE WITHOUT CONTRAST
TECHNIQUE: Multidetector CT imaging of the left ankle was performed according
to the standard protocol. Multiplanar CT image reconstructions were
also generated.

[Series 4: lower ext 1.5 st · axial · 0.37mm/px · z∈[+237,+386]mm · 5 of 144 slices shown, 7 images]
[im 23/144  soft-tissue]
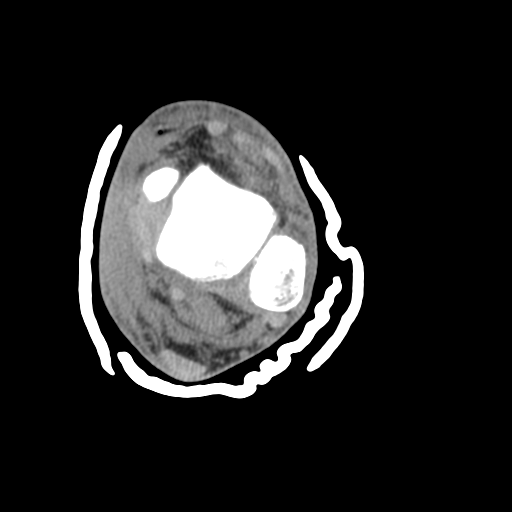
[im 23/144  bone]
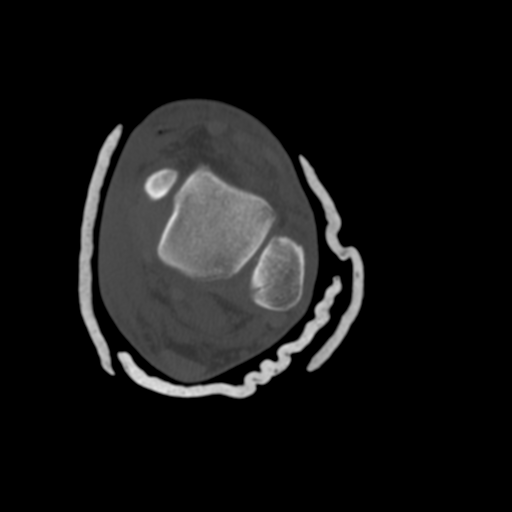
[im 45/144  bone]
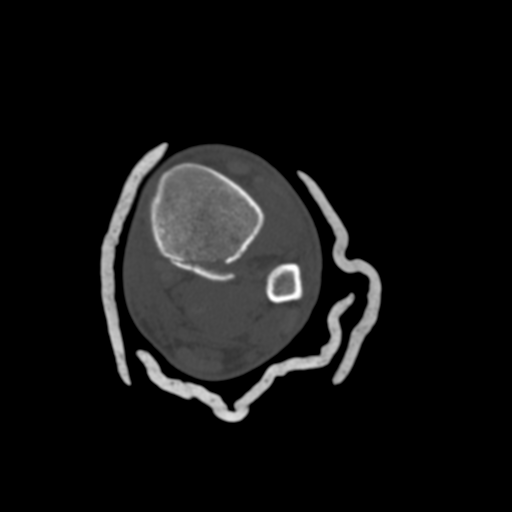
[im 78/144  bone]
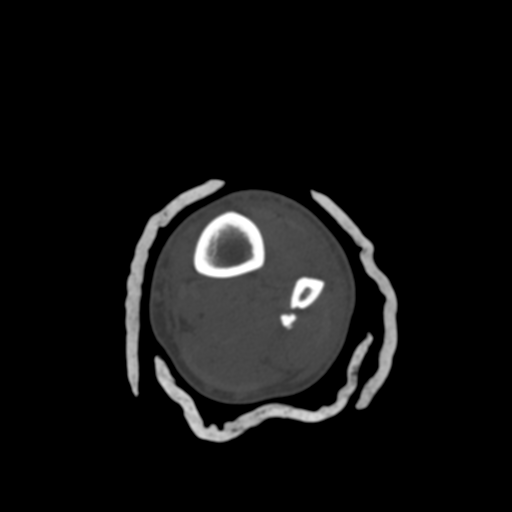
[im 100/144  bone]
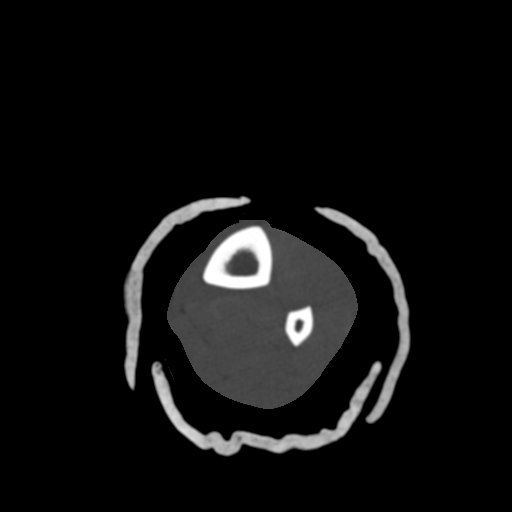
[im 122/144  soft-tissue]
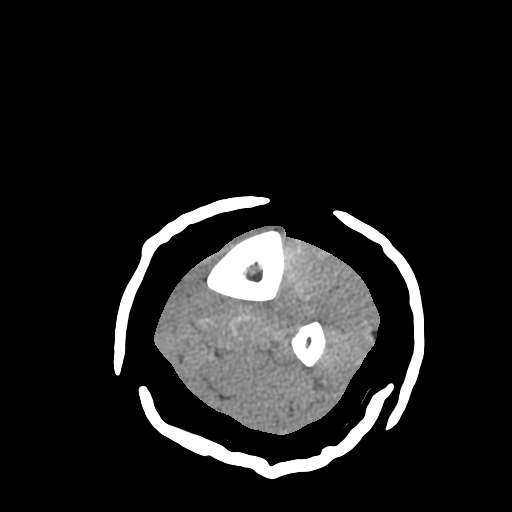
[im 122/144  bone]
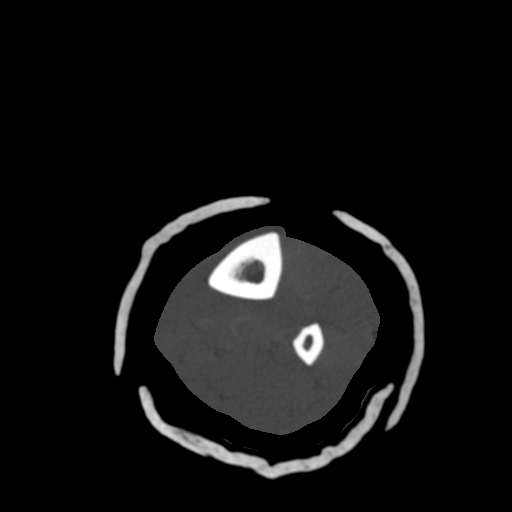

[Series 8: lower ext cor st · coronal · 0.28mm/px · 3 of 129 slices shown]
[im 26/129  bone]
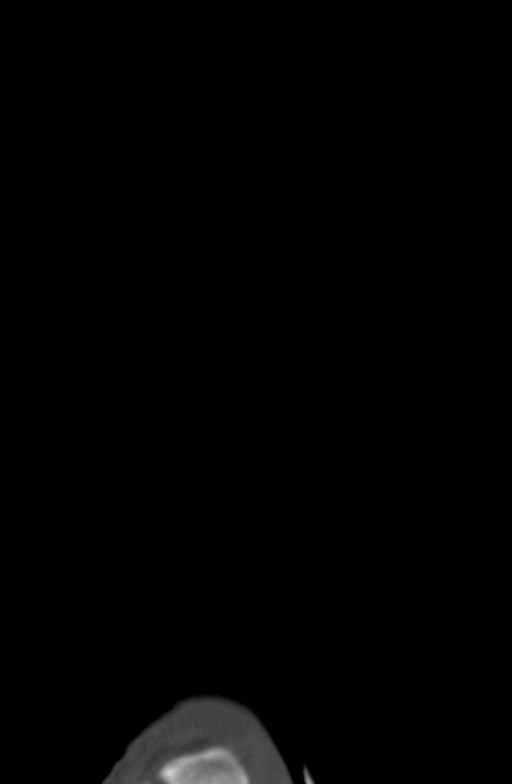
[im 52/129  bone]
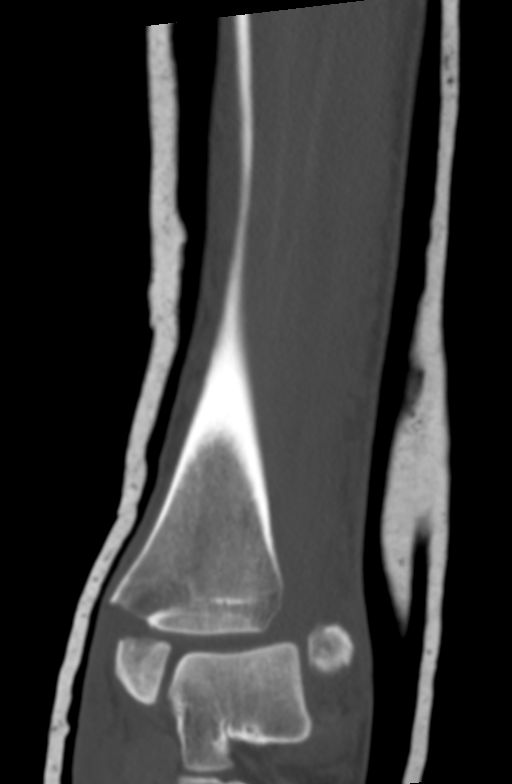
[im 77/129  bone]
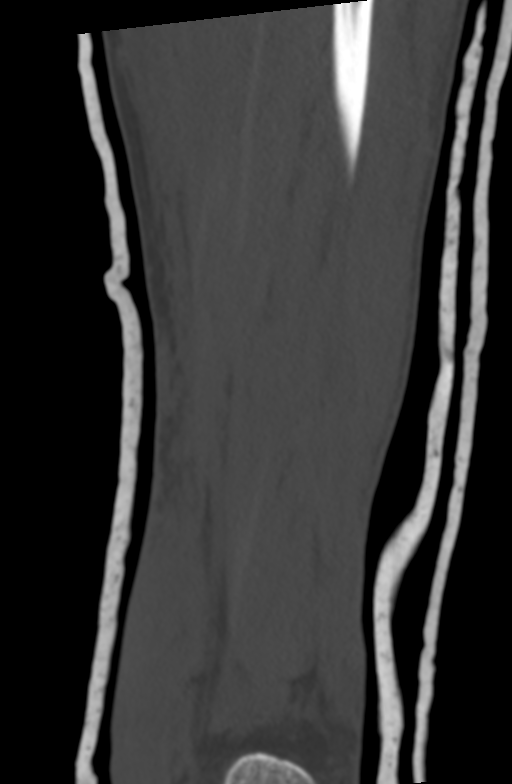

[Series 10: lower ext sag st · sagittal · 0.32mm/px · 5 of 115 slices shown, 6 images]
[im 39/115  bone]
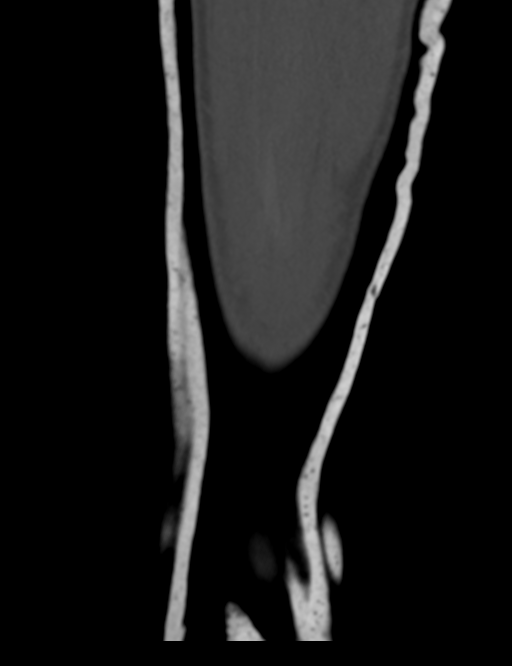
[im 48/115  bone]
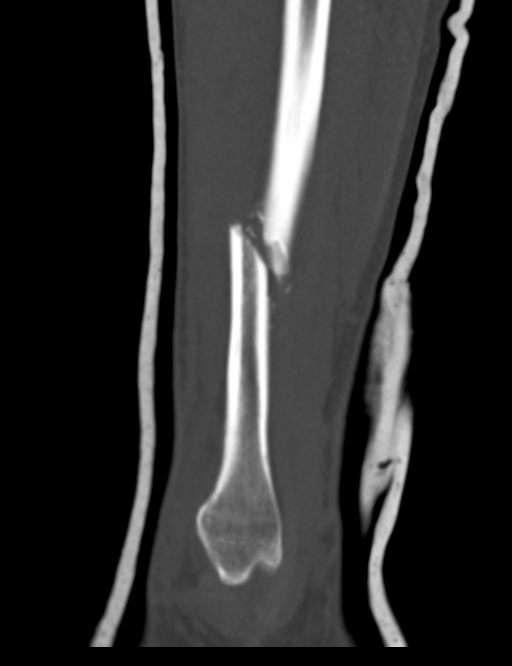
[im 58/115  soft-tissue]
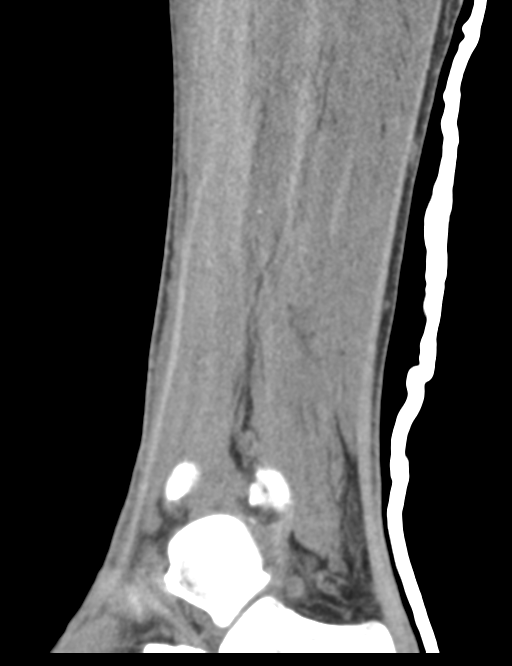
[im 58/115  bone]
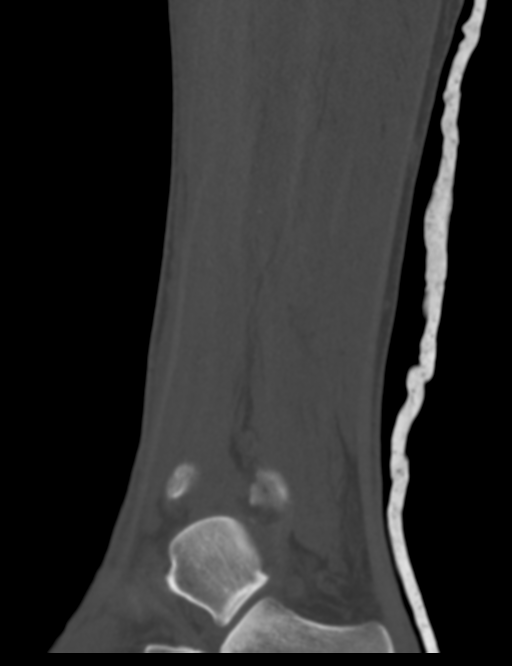
[im 67/115  bone]
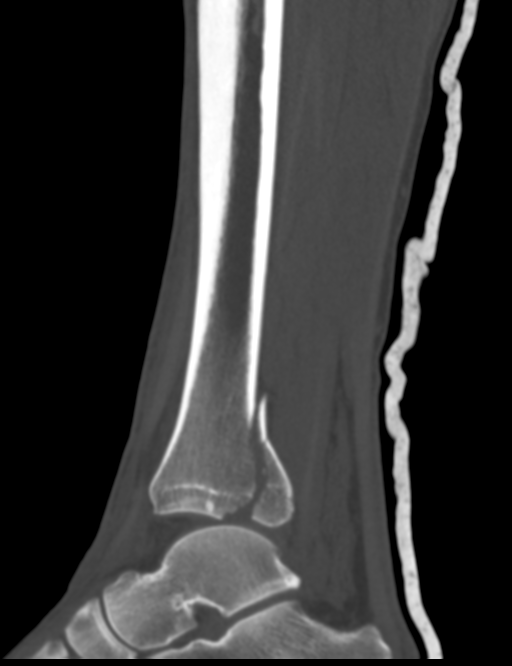
[im 77/115  bone]
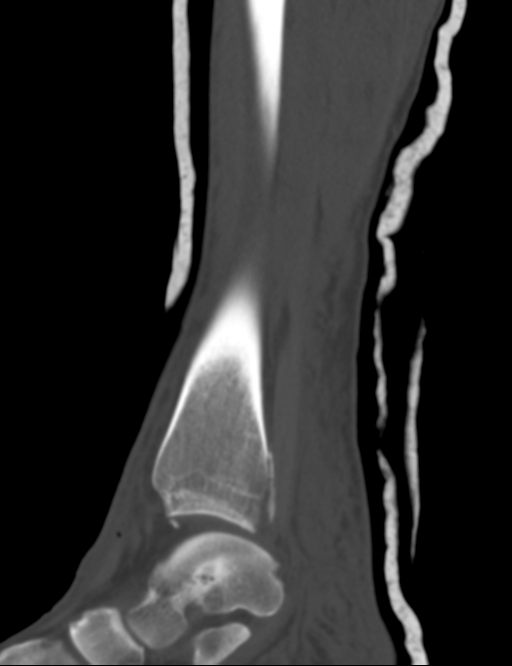

[13 of 35 positions shown; findings below may reference images not displayed]

FINDINGS: Bones/Joint/Cartilage

There is an acute, minimally comminuted, oblique fracture of the
juncture of the middle and distal thirds of the left fibula with [DATE]
shaft with anterior displacement and mild posterior angulation of
the distal fracture fragment.

There is a comminuted fracture of the posterior malleolus with the
fracture plane within the coronal plane with 4-5 mm posterior
displacement of the posterior malleolar fracture fragment resulting
in a roughly 7 mm articular gap involving the tibial plafond. The
fracture fragment comprises approximately 25% of the articular
surface. Several tiny fracture fragments are seen within the
adjacent joint space.

Avulsion type medial malleolar fracture is seen with the fracture
plane within the transverse plane with 6 mm distraction and 5 mm
lateral displacement of the a medial malleolar fracture fragment.

Lateral subluxation of the talar dome in relation to the tibial
plafond by approximately 5-6 mm with mild lateral angulation of the
a tibiotalar articulation. Resultant widening of the tibiofibular
space.

Talus appears intact.  Visualized subtalar joints are aligned.

Ligaments

Suboptimally assessed by CT.

Muscles and Tendons

Unremarkable

Soft tissues

Extensive subcutaneous edema, particularly superficial to the medial
malleolus involving the visualized medial foot.
IMPRESSION: Fracture subluxation of the left ankle as described above with
widening of the tibiofibular syndesmotic space.

## 2022-03-29 IMAGING — RF DG ANKLE COMPLETE 3+V*L*
1 series · 3 of 3 positions shown · non-contrast
Comparison: October 24, 2021.

CLINICAL DATA: Surgical internal fixation of trimalleolar ankle
fracture.

EXAM:
LEFT ANKLE COMPLETE - 3+ VIEW; DG C-ARM 1-60 MIN-NO REPORT
Radiation exposure index: 0.56 mGy.

[Series 1: run · 3 of 3 slices shown]
[im 1/3]
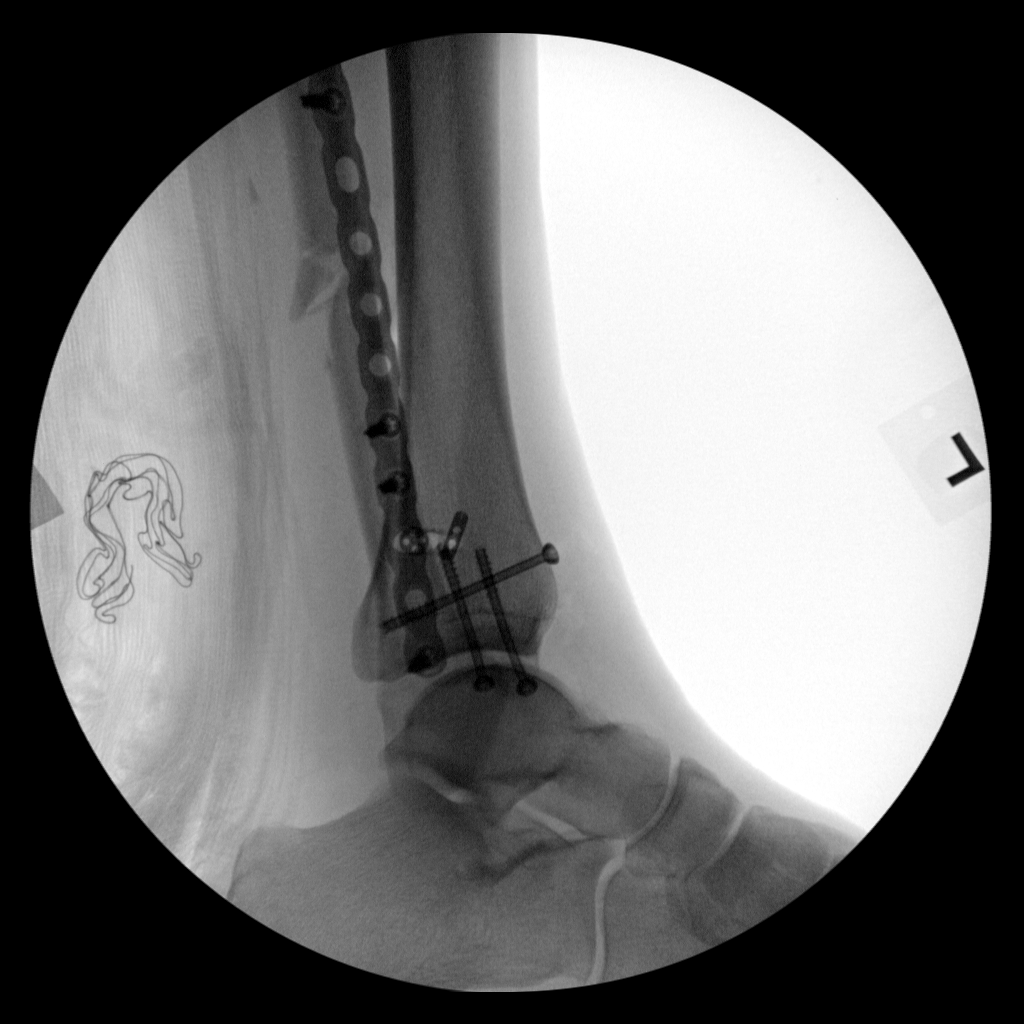
[im 2/3]
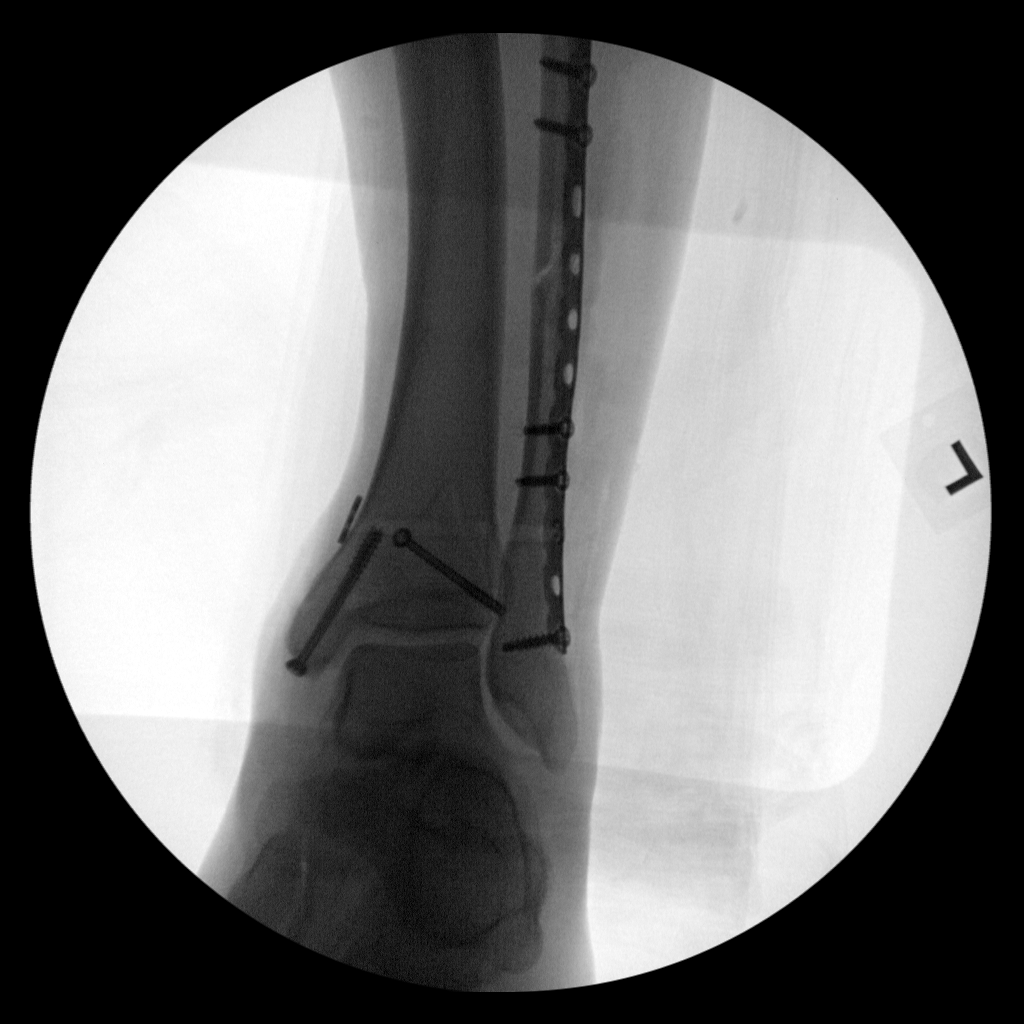
[im 3/3]
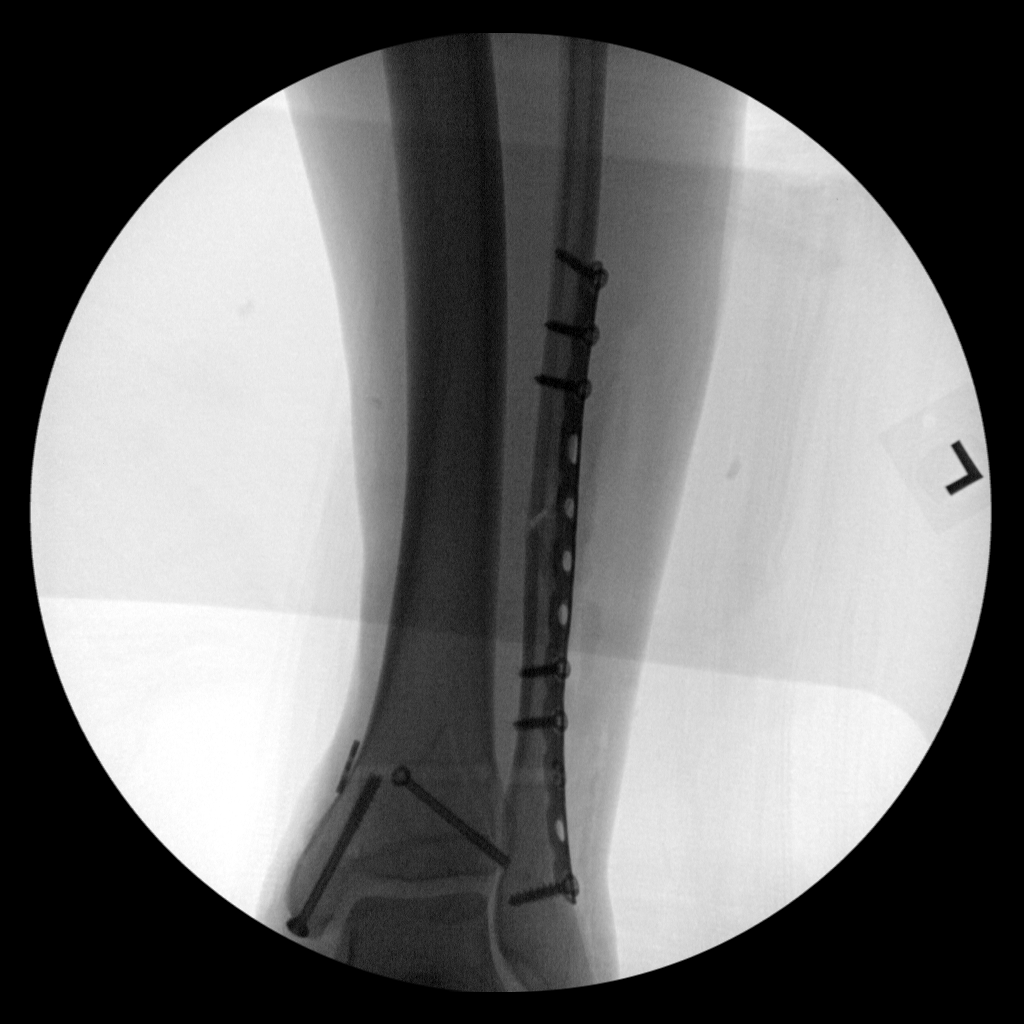

[3 of 3 positions shown; findings below may reference images not displayed]

FINDINGS: Three intraoperative fluoroscopic images were obtained of the left
ankle. These demonstrate the patient be status post surgical
internal fixation of distal left tibial and fibular fractures.
IMPRESSION: Fluoroscopic guidance provided during surgical internal fixation of
distal left tibial and fibular fractures.

## 2023-06-27 ENCOUNTER — Encounter (HOSPITAL_COMMUNITY): Payer: Self-pay

## 2023-06-27 ENCOUNTER — Other Ambulatory Visit: Payer: Self-pay

## 2023-06-27 ENCOUNTER — Emergency Department (HOSPITAL_COMMUNITY)
Admission: EM | Admit: 2023-06-27 | Discharge: 2023-06-29 | Disposition: A | Discharge status: Home / Self Care | Payer: No Typology Code available for payment source | Attending: Emergency Medicine | Admitting: Emergency Medicine

## 2023-06-27 DIAGNOSIS — Z1152 Encounter for screening for COVID-19: Secondary | ICD-10-CM | POA: Diagnosis not present

## 2023-06-27 DIAGNOSIS — F4310 Post-traumatic stress disorder, unspecified: Secondary | ICD-10-CM | POA: Insufficient documentation

## 2023-06-27 DIAGNOSIS — F332 Major depressive disorder, recurrent severe without psychotic features: Secondary | ICD-10-CM | POA: Insufficient documentation

## 2023-06-27 DIAGNOSIS — R45851 Suicidal ideations: Secondary | ICD-10-CM | POA: Insufficient documentation

## 2023-06-27 DIAGNOSIS — Z7982 Long term (current) use of aspirin: Secondary | ICD-10-CM | POA: Diagnosis not present

## 2023-06-27 DIAGNOSIS — Z87891 Personal history of nicotine dependence: Secondary | ICD-10-CM | POA: Diagnosis not present

## 2023-06-27 DIAGNOSIS — G4700 Insomnia, unspecified: Secondary | ICD-10-CM | POA: Insufficient documentation

## 2023-06-27 DIAGNOSIS — J449 Chronic obstructive pulmonary disease, unspecified: Secondary | ICD-10-CM | POA: Insufficient documentation

## 2023-06-27 DIAGNOSIS — F19159 Other psychoactive substance abuse with psychoactive substance-induced psychotic disorder, unspecified: Secondary | ICD-10-CM | POA: Insufficient documentation

## 2023-06-27 DIAGNOSIS — F32A Depression, unspecified: Secondary | ICD-10-CM

## 2023-06-27 HISTORY — DX: Chronic obstructive pulmonary disease, unspecified: J44.9

## 2023-06-27 LAB — COMPREHENSIVE METABOLIC PANEL
ALT: 26 U/L (ref 0–44)
AST: 26 U/L (ref 15–41)
Albumin: 4.9 g/dL (ref 3.5–5.0)
Alkaline Phosphatase: 58 U/L (ref 38–126)
Anion gap: 15 (ref 5–15)
BUN: 8 mg/dL (ref 6–20)
CO2: 26 mmol/L (ref 22–32)
Calcium: 10 mg/dL (ref 8.9–10.3)
Chloride: 96 mmol/L — ABNORMAL LOW (ref 98–111)
Creatinine, Ser: 1.06 mg/dL (ref 0.61–1.24)
GFR, Estimated: >60 mL/min (ref >60)
Glucose, Bld: 95 mg/dL (ref 70–99)
Potassium: 4 mmol/L (ref 3.5–5.1)
Sodium: 137 mmol/L (ref 135–145)
Total Bilirubin: 1.3 mg/dL — ABNORMAL HIGH (ref 0.3–1.2)
Total Protein: 8.7 g/dL — ABNORMAL HIGH (ref 6.5–8.1)

## 2023-06-27 LAB — RAPID URINE DRUG SCREEN, HOSP PERFORMED
Amphetamines: NOT DETECTED
Barbiturates: NOT DETECTED
Benzodiazepines: NOT DETECTED
Cocaine: NOT DETECTED
Opiates: NOT DETECTED
Tetrahydrocannabinol: POSITIVE — AB

## 2023-06-27 LAB — CBC WITH DIFFERENTIAL/PLATELET
Abs Immature Granulocytes: 0.04 10*3/uL (ref 0.00–0.07)
Basophils Absolute: 0.1 10*3/uL (ref 0.0–0.1)
Basophils Relative: 1 %
Eosinophils Absolute: 0.1 10*3/uL (ref 0.0–0.5)
Eosinophils Relative: 1 %
HCT: 50.5 % (ref 39.0–52.0)
Hemoglobin: 16.8 g/dL (ref 13.0–17.0)
Immature Granulocytes: 0 %
Lymphocytes Relative: 17 %
Lymphs Abs: 1.6 10*3/uL (ref 0.7–4.0)
MCH: 31.5 pg (ref 26.0–34.0)
MCHC: 33.3 g/dL (ref 30.0–36.0)
MCV: 94.7 fL (ref 80.0–100.0)
Monocytes Absolute: 0.5 10*3/uL (ref 0.1–1.0)
Monocytes Relative: 5 %
Neutro Abs: 7.5 10*3/uL (ref 1.7–7.7)
Neutrophils Relative %: 76 %
Platelets: 323 10*3/uL (ref 150–400)
RBC: 5.33 MIL/uL (ref 4.22–5.81)
RDW: 12.3 % (ref 11.5–15.5)
WBC: 9.7 10*3/uL (ref 4.0–10.5)
nRBC: 0 % (ref 0.0–0.2)

## 2023-06-27 LAB — ETHANOL: Alcohol, Ethyl (B): <10 mg/dL (ref <10)

## 2023-06-27 MED ORDER — LORAZEPAM 2 MG/ML IJ SOLN: 2.0000 mg | Freq: Four times a day (QID) | INTRAMUSCULAR | Status: DC | PRN

## 2023-06-27 MED ORDER — LORAZEPAM 1 MG PO TABS
2.0000 mg | ORAL_TABLET | Freq: Four times a day (QID) | ORAL | Status: DC | PRN
  Administered 2023-06-27 – 2023-06-28 (×3): 2 mg via ORAL
  Filled 2023-06-27 (×3): qty 2

## 2023-06-27 MED ORDER — TRAZODONE HCL 50 MG PO TABS
50.0000 mg | ORAL_TABLET | Freq: Every evening | ORAL | Status: DC | PRN
  Administered 2023-06-27 – 2023-06-28 (×2): 50 mg via ORAL
  Filled 2023-06-27 (×2): qty 1

## 2023-06-27 MED ORDER — NICOTINE POLACRILEX 2 MG MT GUM
2.0000 mg | CHEWING_GUM | OROMUCOSAL | Status: DC | PRN
  Administered 2023-06-27 – 2023-06-29 (×6): 2 mg via ORAL
  Filled 2023-06-27 (×7): qty 1

## 2023-06-27 MED ORDER — HYDROXYZINE HCL 25 MG PO TABS
25.0000 mg | ORAL_TABLET | Freq: Three times a day (TID) | ORAL | Status: DC | PRN
  Administered 2023-06-29: 25 mg via ORAL
  Filled 2023-06-27: qty 1

## 2023-06-27 MED ORDER — SERTRALINE HCL 25 MG PO TABS
25.0000 mg | ORAL_TABLET | Freq: Every day | ORAL | Status: DC
  Administered 2023-06-27 – 2023-06-28 (×2): 25 mg via ORAL
  Filled 2023-06-27 (×2): qty 1

## 2023-06-27 NOTE — ED Notes (Signed)
Patient states he does not need to go to a [place] here in [place] if one is open else where.

## 2023-06-27 NOTE — ED Notes (Signed)
Pt showering. Sitter remains with patient.

## 2023-06-27 NOTE — ED Notes (Signed)
All loose cords have been taken out of the room. Charge Nurse was notified that sitter is needed.
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
## 2023-06-27 NOTE — ED Notes (Signed)
Jake Morgan, Consulting civil engineer made aware of Recruitment consultant need for patient. No available sitter at this time. Will continue to monitor pt while in ED. Pt is resting comfortably on stretcher with safety precautions in place. Pt denies any complaints at this time.

## 2023-06-27 NOTE — ED Provider Notes (Signed)
[place] EMERGENCY DEPARTMENT AT [facility] Provider Note   CSN: [id number] Arrival date & time: 06/27/23  1116     History  Chief Complaint  Patient presents with   Depression    Jake Morgan is a 47 y.o. male with history of PTSD, COPD, who presents the emergency department complaining of worsening depression.  Patient states that he is struggled with PTSD since leaving the service over 20 years ago.  He has had several traumatic incidences since then, making his depression worse.  He states he lost his job in the past few years, and things have gone downhill since then.  He normally follows at the [place], just had an intake for their psychiatric services 2 months ago.  They state that the closest appointment for him is in October, and he does not feel he can wait that long.  He endorses passive suicidal ideation, without a plan.  I struggled with this for some time, worse in the past few months. Associated paranoia and hypervigilance. Denies homicidal ideation, visual or auditory hallucinations.  He has been on Wellbutrin, but did not see much benefit with it.  Has not eaten in about 2 days, and is not sleeping well. He lives with his wife and 66 year old daughter.    Depression       Home Medications Prior to Admission medications   Medication Sig Start Date End Date Taking? Authorizing Provider  acetaminophen (TYLENOL) 500 MG tablet Take 1,000 mg by mouth every 6 (six) hours as needed for mild pain, moderate pain or headache.    [provider]  aspirin 325 MG tablet Take 325 mg by mouth daily.    [provider]  buPROPion (WELLBUTRIN XL) 150 MG 24 hr tablet Take 150 mg by mouth daily. 03/30/23   [provider]  hydrOXYzine (ATARAX) 25 MG tablet Take 1-2 tablets (25-50 mg total) by mouth 3 (three) times daily as needed for anxiety. Patient taking differently: Take 25 mg by mouth 2 (two) times daily. 10/25/21   Meuth, Brooke A, PA-C  ibuprofen  (ADVIL) 200 MG tablet Take 800-1,000 mg by mouth daily as needed for moderate pain.    [provider]  methocarbamol (ROBAXIN) 500 MG tablet Take 1 tablet (500 mg total) by mouth every 6 (six) hours as needed for muscle spasms. 10/25/21   Meuth, Brooke A, PA-C  mirtazapine (REMERON) 15 MG tablet Take 15 mg by mouth at bedtime. 06/22/23   [provider]  Multiple Vitamins-Minerals (MULTIVITAMIN GUMMIES ADULT) CHEW Chew 2 capsules by mouth daily.    [provider]  sildenafil (VIAGRA) 100 MG tablet Take 50-100 mg by mouth daily as needed for erectile dysfunction. 06/22/23   [provider]      Allergies    Patient has no known allergies.    Review of Systems   Review of Systems  Constitutional:  Positive for appetite change.  Psychiatric/Behavioral:  Positive for behavioral problems, depression, sleep disturbance and suicidal ideas.   All other systems reviewed and are negative.   Physical Exam Updated Vital Signs BP 115/80   Pulse 64   Temp 97.9 F (36.6 C) (Oral)   Resp 18   Ht 6' (1.829 m)   Wt 65.8 kg   SpO2 100%   BMI 19.67 kg/m  Physical Exam Vitals and nursing note reviewed.  Constitutional:      Appearance: Normal appearance.  HENT:     Head: Normocephalic and atraumatic.  Eyes:  Conjunctiva/sclera: Conjunctivae normal.  Pulmonary:     Effort: Pulmonary effort is normal. No respiratory distress.  Skin:    General: Skin is warm and dry.  Neurological:     Mental Status: He is alert.  Psychiatric:        Attention and Perception: Attention and perception normal.        Mood and Affect: Mood is depressed. Affect is tearful.        Speech: Speech normal.        Behavior: Behavior normal. Behavior is cooperative.        Thought Content: Thought content includes suicidal ideation. Thought content does not include homicidal ideation. Thought content does not include homicidal or suicidal plan.     ED Results / Procedures /  Treatments   Labs (all labs ordered are listed, but only abnormal results are displayed) Labs Reviewed  COMPREHENSIVE METABOLIC PANEL - Abnormal; Notable for the following components:      Result Value   Chloride 96 (*)    Total Protein 8.7 (*)    Total Bilirubin 1.3 (*)    All other components within normal limits  RAPID URINE DRUG SCREEN, HOSP PERFORMED - Abnormal; Notable for the following components:   Tetrahydrocannabinol POSITIVE (*)    All other components within normal limits  ETHANOL  CBC WITH DIFFERENTIAL/PLATELET    EKG None  Radiology No results found.  Procedures Procedures    Medications Ordered in ED Medications - No data to display  ED Course/ Medical Decision Making/ A&P                                 Medical Decision Making Amount and/or Complexity of Data Reviewed Labs: ordered.  Patient is a 47 y.o. male  who presents to the emergency department for psychiatric complaint. He is here voluntarily.   Past Medical History: PTSD, COPD  Physical Exam: Normal vital signs, no acute distress.  Sitting comfortably in exam chair.  Endorses passive suicidal ideation, without plan.  Denies HI or AVH.  Labs: Medical clearance labs ordered, with following pertinent results: UDS positive for THC. CBC unremarkable. Ethanol negative. CMP unremarkable.   Disposition: Patient is otherwise medically cleared at this time pending medical clearance laboratory evaluation. Will consult TTS and appreciate their recommendations.  Final Clinical Impression(s) / ED Diagnoses Final diagnoses:  Depression, unspecified depression type  PTSD (post-traumatic stress disorder)    Rx / DC Orders ED Discharge Orders     None      Portions of this report may have been transcribed using voice recognition software. Every effort was made to ensure accuracy; however, inadvertent computerized transcription errors may be present.    Jeanella Flattery 06/27/23  1513    Rondel Baton, MD 06/29/23 1352

## 2023-06-27 NOTE — ED Notes (Signed)
Patient sleeping

## 2023-06-27 NOTE — ED Notes (Signed)
Pt dressed in paper scrubs due to no burgundy scrubs being available. Belongings bagged, labeled and put in locker #6.

## 2023-06-27 NOTE — ED Notes (Signed)
Psych NP at bedside talking with patient.

## 2023-06-27 NOTE — ED Triage Notes (Signed)
Pt reports increased depression for a while now, denies SI with plan but states he could see the world without him in it. Takes wellbutrin

## 2023-06-27 NOTE — Progress Notes (Signed)
Per Eligha Bridegroom, NP, pt recommended for inpatient treatment. Pt is veteran and prefers placement at [facility]. CSW spoke with April, transfer coordinator, at [facility] via phone call. April reports [place], [place], [place], and [place] are all at capacity. CSW will continue to assist with placement. CSW will check with [facility] tomorrow morning regarding bed availability.  Cathie Beams, [place]  06/27/2023 3:23 PM

## 2023-06-28 ENCOUNTER — Encounter (HOSPITAL_COMMUNITY): Payer: Self-pay | Admitting: Psychiatry

## 2023-06-28 DIAGNOSIS — F4310 Post-traumatic stress disorder, unspecified: Secondary | ICD-10-CM

## 2023-06-28 LAB — RESP PANEL BY RT-PCR (RSV, FLU A&B, COVID)  RVPGX2
Influenza A by PCR: NEGATIVE
Influenza B by PCR: NEGATIVE
Resp Syncytial Virus by PCR: NEGATIVE
SARS Coronavirus 2 by RT PCR: NEGATIVE

## 2023-06-28 MED ORDER — NICOTINE 14 MG/24HR TD PT24
14.0000 mg | MEDICATED_PATCH | Freq: Every day | TRANSDERMAL | Status: DC
  Administered 2023-06-28 – 2023-06-29 (×2): 14 mg via TRANSDERMAL
  Filled 2023-06-28 (×2): qty 1

## 2023-06-28 MED ORDER — SERTRALINE HCL 50 MG PO TABS
50.0000 mg | ORAL_TABLET | Freq: Every day | ORAL | Status: DC
  Administered 2023-06-29: 50 mg via ORAL
  Filled 2023-06-28: qty 1

## 2023-06-28 MED ORDER — NICOTINE POLACRILEX 2 MG MT GUM: 2.0000 mg | CHEWING_GUM | OROMUCOSAL | Status: DC | PRN

## 2023-06-28 NOTE — Progress Notes (Signed)
8:42 AM - CSW spoke with intake staff at [facility] via phone call. Intake staff report they have 2 male beds available for inpatient psych treatment. CSW will complete referral packet and fax to [facility] [phone].  Cathie Beams, LCSW  06/28/2023 8:45 AM

## 2023-06-28 NOTE — Progress Notes (Signed)
LCSW Progress Note  [id number]   Jake Morgan  06/28/2023  1:14 AM    Inpatient Behavioral Health Placement  Pt meets inpatient criteria per [facility]. There are no available beds within [facility] per [facility] Kenisha Herbin,RN. Referral was sent to the following facilities;    Destination  Service Provider Address Phone Fax  [facility]  [address], [place] [address] [phone]  [facility]  [place] [phone]  [facility]  [address] [place] [address] [phone]  [place] [phone]  [facility] [place] [phone]  [facility] (after hours)  [address] [place] [address] [phone]  [place] [phone]  [facility]  [address], [place] [address] [phone]   Situation ongoing,  CSW will follow up.    Maryjean Ka, MSW, LCSWA 06/28/2023 1:14 AM

## 2023-06-28 NOTE — ED Notes (Signed)
Please update mother  [phone]
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
## 2023-06-28 NOTE — Progress Notes (Addendum)
ADDENDUM  1:40 PM - CSW spoke with intake staff at [facility] via phone call. Intake staff report pt referral has been received and pt is now under review. Intake staff advised CSW to call [facility] [phone]. [phone], in a bit for update.   CSW completed referral packet and faxed to [facility] [phone] for review. CSW will await follow up.  Cathie Beams, [place]  06/28/2023 12:36 PM

## 2023-06-28 NOTE — ED Notes (Signed)
Patient awake and alert, no s/s of distress, will continue to monitor.

## 2023-06-28 NOTE — Progress Notes (Addendum)
Pt has been accepted to [facility] TODAY 06/28/2023. Bed assignment: Admit from ED to Behavioral Health Unit  Pt meets inpatient criteria per Arsenio Loader, NP  Attending Physician will be Clemetine Marker, MD  Report can be called to: [phone] ext. [phone]  Pt can arrive BEFORE 10 PM TONIGHT  Care Team Notified: [facility] Rona Ravens, RN, Arsenio Loader, NP, Delorise Royals, RN, and Aletta Edouard, RN  [place], [place]  06/28/2023 3:33 PM

## 2023-06-28 NOTE — ED Notes (Signed)
Called report to [place], Charity fundraiser at the [place].

## 2023-06-29 ENCOUNTER — Other Ambulatory Visit: Payer: Self-pay

## 2023-06-29 ENCOUNTER — Encounter (HOSPITAL_COMMUNITY): Payer: Self-pay | Admitting: Psychiatry

## 2023-06-29 ENCOUNTER — Inpatient Hospital Stay (HOSPITAL_COMMUNITY)
Admission: AD | Admit: 2023-06-29 | Discharge: 2023-07-03 | DRG: 885 | Disposition: A | Discharge status: Home / Self Care | Payer: Non-veteran care | Source: Intra-hospital | Attending: Psychiatry | Admitting: Psychiatry

## 2023-06-29 DIAGNOSIS — Z560 Unemployment, unspecified: Secondary | ICD-10-CM

## 2023-06-29 DIAGNOSIS — F332 Major depressive disorder, recurrent severe without psychotic features: Principal | ICD-10-CM | POA: Diagnosis present

## 2023-06-29 DIAGNOSIS — Z79899 Other long term (current) drug therapy: Secondary | ICD-10-CM

## 2023-06-29 DIAGNOSIS — R45851 Suicidal ideations: Secondary | ICD-10-CM | POA: Diagnosis present

## 2023-06-29 DIAGNOSIS — F1210 Cannabis abuse, uncomplicated: Secondary | ICD-10-CM | POA: Diagnosis present

## 2023-06-29 DIAGNOSIS — J449 Chronic obstructive pulmonary disease, unspecified: Secondary | ICD-10-CM | POA: Diagnosis present

## 2023-06-29 DIAGNOSIS — F411 Generalized anxiety disorder: Secondary | ICD-10-CM | POA: Diagnosis present

## 2023-06-29 DIAGNOSIS — Z5986 Financial insecurity: Secondary | ICD-10-CM

## 2023-06-29 DIAGNOSIS — F17290 Nicotine dependence, other tobacco product, uncomplicated: Secondary | ICD-10-CM | POA: Diagnosis present

## 2023-06-29 DIAGNOSIS — Z91410 Personal history of adult physical and sexual abuse: Secondary | ICD-10-CM | POA: Diagnosis not present

## 2023-06-29 DIAGNOSIS — Z62810 Personal history of physical and sexual abuse in childhood: Secondary | ICD-10-CM | POA: Diagnosis not present

## 2023-06-29 DIAGNOSIS — F4310 Post-traumatic stress disorder, unspecified: Secondary | ICD-10-CM | POA: Diagnosis present

## 2023-06-29 DIAGNOSIS — F17200 Nicotine dependence, unspecified, uncomplicated: Secondary | ICD-10-CM | POA: Diagnosis present

## 2023-06-29 MED ORDER — NICOTINE POLACRILEX 2 MG MT GUM
2.0000 mg | CHEWING_GUM | OROMUCOSAL | Status: DC | PRN
  Administered 2023-06-29 – 2023-07-03 (×21): 2 mg via ORAL
  Filled 2023-06-29 (×11): qty 1

## 2023-06-29 MED ORDER — LORAZEPAM 2 MG/ML IJ SOLN: 2.0000 mg | Freq: Three times a day (TID) | INTRAMUSCULAR | Status: DC | PRN

## 2023-06-29 MED ORDER — LORAZEPAM 1 MG PO TABS: 2.0000 mg | ORAL_TABLET | Freq: Three times a day (TID) | ORAL | Status: DC | PRN

## 2023-06-29 MED ORDER — ALUM & MAG HYDROXIDE-SIMETH 200-200-20 MG/5ML PO SUSP: 30.0000 mL | ORAL | Status: DC | PRN

## 2023-06-29 MED ORDER — HALOPERIDOL LACTATE 5 MG/ML IJ SOLN: 5.0000 mg | Freq: Three times a day (TID) | INTRAMUSCULAR | Status: DC | PRN

## 2023-06-29 MED ORDER — MAGNESIUM HYDROXIDE 400 MG/5ML PO SUSP: 30.0000 mL | Freq: Every day | ORAL | Status: DC | PRN

## 2023-06-29 MED ORDER — ACETAMINOPHEN 325 MG PO TABS: 650.0000 mg | ORAL_TABLET | Freq: Four times a day (QID) | ORAL | Status: DC | PRN

## 2023-06-29 MED ORDER — DIPHENHYDRAMINE HCL 25 MG PO CAPS: 50.0000 mg | ORAL_CAPSULE | Freq: Three times a day (TID) | ORAL | Status: DC | PRN

## 2023-06-29 MED ORDER — ENSURE ENLIVE PO LIQD
237.0000 mL | Freq: Two times a day (BID) | ORAL | Status: DC
  Administered 2023-06-29 – 2023-07-03 (×7): 237 mL via ORAL
  Filled 2023-06-29 (×12): qty 237

## 2023-06-29 MED ORDER — SERTRALINE HCL 50 MG PO TABS
50.0000 mg | ORAL_TABLET | Freq: Every day | ORAL | Status: DC
  Administered 2023-06-30 – 2023-07-01 (×2): 50 mg via ORAL
  Filled 2023-06-29 (×3): qty 1

## 2023-06-29 MED ORDER — NICOTINE 14 MG/24HR TD PT24
14.0000 mg | MEDICATED_PATCH | Freq: Every day | TRANSDERMAL | Status: DC
  Filled 2023-06-29 (×3): qty 1

## 2023-06-29 MED ORDER — HALOPERIDOL 5 MG PO TABS: 5.0000 mg | ORAL_TABLET | Freq: Three times a day (TID) | ORAL | Status: DC | PRN

## 2023-06-29 MED ORDER — TRAZODONE HCL 50 MG PO TABS
50.0000 mg | ORAL_TABLET | Freq: Every evening | ORAL | Status: DC | PRN
  Administered 2023-06-29 – 2023-07-02 (×4): 50 mg via ORAL
  Filled 2023-06-29 (×4): qty 1

## 2023-06-29 MED ORDER — DIPHENHYDRAMINE HCL 50 MG/ML IJ SOLN: 50.0000 mg | Freq: Three times a day (TID) | INTRAMUSCULAR | Status: DC | PRN

## 2023-06-29 NOTE — Plan of Care (Signed)
  Problem: Education: Goal: Knowledge of [facility] Education information/materials will improve Outcome: Progressing Goal: Verbalization of understanding the information provided will improve Outcome: Progressing   

## 2023-06-29 NOTE — Progress Notes (Signed)
8:29 AM - CSW received phone call from [facility] intake staff. Pt's bed offer has been rescinded due to delay in transportation last night. [facility] [place] has 4 beds available, however they also have 4 pts in their ED which take priority. [facility] [place] staff report [place] [facility] [place] are at capacity. CSW will continue to assist with placement.  Cathie Beams, [place]  06/29/2023 8:56 AM

## 2023-06-29 NOTE — ED Notes (Signed)
Asked secretary to call safe transport for pt transfer to [facility]

## 2023-06-29 NOTE — Progress Notes (Signed)
Adult Psychoeducational Group Note  Date:  06/29/2023 Time:  8:33 PM  Group Topic/Focus:  Wrap-Up Group:   The focus of this group is to help patients review their daily goal of treatment and discuss progress on daily workbooks.  Participation Level:  Active  Participation Quality:  Appropriate  Affect:  Appropriate  Cognitive:  Appropriate  Insight: Appropriate  Engagement in Group:  Engaged  Modes of Intervention:  Discussion and Support  Additional Comments:  Pt states goal today, was to get here and figure out how everything goes. Pt states goal was achieved. Pt rates day a 5/10 after getting here but having good conversation with other peers. Pt states something positive that happened for the pt today, was meeting peers. Pt states goal tomorrow, is to talk with someone that will help pt get access to the right resources.   Jake Morgan 06/29/2023, 8:33 PM

## 2023-06-29 NOTE — Tx Team (Signed)
Initial Treatment Plan 06/29/2023 4:26 PM Jake Morgan [id number]    PATIENT STRESSORS: Occupational concerns   Traumatic event     PATIENT STRENGTHS: Average or above average intelligence  Capable of independent living  Communication skills  Motivation for treatment/growth  Supportive family/friends    PATIENT IDENTIFIED PROBLEMS: Suicidal ideation   Increased depression  PTSD    Panic attacks    "I lost my job a year ago. "         DISCHARGE CRITERIA:  Improved stabilization in mood, thinking, and/or behavior Motivation to continue treatment in a less acute level of care Reduction of life-threatening or endangering symptoms to within safe limits Verbal commitment to aftercare and medication compliance  PRELIMINARY DISCHARGE PLAN: Outpatient therapy Return to previous living arrangement  PATIENT/FAMILY INVOLVEMENT: This treatment plan has been presented to and reviewed with the patient, Jake Morgan, .  The patient has been given the opportunity to ask questions and make suggestions.  Shela Nevin, RN 06/29/2023, 4:26 PM

## 2023-06-29 NOTE — Progress Notes (Signed)
Pt has been accepted to [facility] TODAY 06/29/2023, pending EKG and signed voluntary consent faxed to [phone]. Bed assignment: 402-2  Pt meets inpatient criteria per Arsenio Loader, NP  Attending Physician will be Phineas Inches, MD  Report can be called to: - Adult unit: [phone]  Pt can arrive after pending discharges  Care Team Notified: [facility] Malva Limes, RN, Arsenio Loader, NP, Denton Ar, RN, and Tommi Rumps, RN  [place], LCSW  06/29/2023 9:57 AM

## 2023-06-29 NOTE — Progress Notes (Signed)
Pt is a 47 year old male who presented voluntarily from [facility] ED with complaints of increased depression and worsening PTSD from traumatic events he experienced while serving in the [employer]. Pt reported worsening depression, hopelessness, panic attacks, poor sleep, loss of appetite, and had lost his job over a year ago due to his mental decline.  Pt initially presented irritable, voicing frustration over the fact that he was unable to get in with the [place] where he could receive the help he needed. Other than presenting mildly irritable, pt was calm and cooperative, answered questions logically and coherently during admission interview and assessment. Admission paperwork completed and signed. Verbal understanding expressed. VS monitored and recorded. Skin check performed with MHT and revealed no abnormalities.  Patient was oriented to unit and schedule. Pt denies SI/HI/AVH at this time. Q 15 min checks initiated for safety.

## 2023-06-29 NOTE — ED Notes (Signed)
Pt ambulated to safe transport by sitter. All belongings and paperwork given to safe transport. VSS and ambulatory w/ steady gait.

## 2023-06-29 NOTE — ED Notes (Signed)
Notified Diplomatic Services operational officer of need for voluntary consent to be faxed to [facility]

## 2023-06-29 NOTE — ED Provider Notes (Signed)
Emergency Medicine Observation Re-evaluation Note  Jake Morgan is a 47 y.o. male, seen on rounds today.  Pt initially presented to the ED for complaints of Depression Currently, the patient is resting comfortably in his bed.  Physical Exam  BP (!) 96/55   Pulse 73   Temp 98.3 F (36.8 C)   Resp 17   Ht 1.829 m (6')   Wt 65.8 kg   SpO2 99%   BMI 19.67 kg/m  Physical Exam General: Well-developed well-nourished male does not appear to be in any acute distress Cardiac: Heart rate is normal, there was a lowish blood pressure recorded at 1537 yesterday of 96/55 and is now normalized at 109 84 this morning Lungs: No distress with normal respiratory rate and oxygen saturations Psych: Patient states that he does not understand why he is being isolated in her room.  He states that he has had thoughts of death but has not thought of harming himself.  He states that he wants somebody to talk to.  ED Course / MDM  EKG:   I have reviewed the labs performed to date as well as medications administered while in observation.  Recent changes in the last 24 hours include patient has been seen by psych and recommended for inpatient management.  Plan  Current plan is for placement in behavioral health hospital.    Margarita Grizzle, MD 06/29/23 [phone]

## 2023-06-29 NOTE — Progress Notes (Signed)
Pt was unable to be transferred to [facility] due to a delay of transportation.  Transport unable to transport pt to [facility] on 06/28/23 by 10pm. Per nursing Adonis Brook, RN informed nursing will assist to coordinate transport for 1st shift.  Maryjean Ka, MSW, LCSWA 06/29/2023 1:46 AM

## 2023-06-29 NOTE — ED Notes (Signed)
Attempted report x1. 

## 2023-06-30 ENCOUNTER — Encounter (HOSPITAL_COMMUNITY): Payer: Self-pay

## 2023-06-30 DIAGNOSIS — F411 Generalized anxiety disorder: Secondary | ICD-10-CM | POA: Diagnosis present

## 2023-06-30 DIAGNOSIS — F17200 Nicotine dependence, unspecified, uncomplicated: Secondary | ICD-10-CM | POA: Diagnosis present

## 2023-06-30 DIAGNOSIS — F332 Major depressive disorder, recurrent severe without psychotic features: Secondary | ICD-10-CM | POA: Diagnosis not present

## 2023-06-30 DIAGNOSIS — F1210 Cannabis abuse, uncomplicated: Secondary | ICD-10-CM | POA: Diagnosis present

## 2023-06-30 MED ORDER — HYDROXYZINE HCL 25 MG PO TABS
25.0000 mg | ORAL_TABLET | Freq: Three times a day (TID) | ORAL | Status: DC | PRN
  Administered 2023-06-30 – 2023-07-02 (×3): 25 mg via ORAL
  Filled 2023-06-30 (×3): qty 1

## 2023-06-30 MED ORDER — GABAPENTIN 100 MG PO CAPS
200.0000 mg | ORAL_CAPSULE | Freq: Three times a day (TID) | ORAL | Status: DC
  Administered 2023-06-30 – 2023-07-02 (×6): 200 mg via ORAL
  Filled 2023-06-30 (×13): qty 2

## 2023-06-30 NOTE — BH IP Treatment Plan (Signed)
Interdisciplinary Treatment and Diagnostic Plan Update  06/30/2023 Time of Session: 11:10 AM  Jake Morgan MRN: [id number]  Principal Diagnosis: PTSD (post-traumatic stress disorder)  Secondary Diagnoses: Principal Problem:   PTSD (post-traumatic stress disorder)   Current Medications:  Current Facility-Administered Medications  Medication Dose Route Frequency Provider Last Rate Last Admin   acetaminophen (TYLENOL) tablet 650 mg  650 mg Oral Q6H PRN Lenox Ponds, NP       alum & mag hydroxide-simeth (MAALOX/MYLANTA) 200-200-20 MG/5ML suspension 30 mL  30 mL Oral Q4H PRN Lenox Ponds, NP       diphenhydrAMINE (BENADRYL) capsule 50 mg  50 mg Oral TID PRN Lenox Ponds, NP       Or   diphenhydrAMINE (BENADRYL) injection 50 mg  50 mg Intramuscular TID PRN Lenox Ponds, NP       feeding supplement (ENSURE ENLIVE / ENSURE PLUS) liquid 237 mL  237 mL Oral BID BM Massengill, Harrold Donath, MD   237 mL at 06/29/23 2135   haloperidol (HALDOL) tablet 5 mg  5 mg Oral TID PRN Lenox Ponds, NP       Or   haloperidol lactate (HALDOL) injection 5 mg  5 mg Intramuscular TID PRN Lenox Ponds, NP       LORazepam (ATIVAN) tablet 2 mg  2 mg Oral TID PRN Lenox Ponds, NP       Or   LORazepam (ATIVAN) injection 2 mg  2 mg Intramuscular TID PRN Lenox Ponds, NP       magnesium hydroxide (MILK OF MAGNESIA) suspension 30 mL  30 mL Oral Daily PRN Lenox Ponds, NP       nicotine (NICODERM CQ - dosed in mg/24 hours) patch 14 mg  14 mg Transdermal Q0600 Lenox Ponds, NP       nicotine polacrilex (NICORETTE) gum 2 mg  2 mg Oral Q1H PRN Lenox Ponds, NP   2 mg at 06/30/23 1019   sertraline (ZOLOFT) tablet 50 mg  50 mg Oral Daily Lenox Ponds, NP   50 mg at 06/30/23 0819   traZODone (DESYREL) tablet 50 mg  50 mg Oral QHS PRN Lenox Ponds, NP   50 mg at 06/29/23 2037   PTA Medications: Medications Prior to Admission  Medication Sig Dispense Refill Last Dose   buPROPion  (WELLBUTRIN XL) 150 MG 24 hr tablet Take 150 mg by mouth daily.      mirtazapine (REMERON) 15 MG tablet Take 15 mg by mouth at bedtime.      Multiple Vitamins-Minerals (MULTIVITAMIN GUMMIES ADULT) CHEW Chew 2 capsules by mouth daily.      sildenafil (VIAGRA) 100 MG tablet Take 50-100 mg by mouth daily as needed for erectile dysfunction.       Patient Stressors: Occupational concerns   Traumatic event    Patient Strengths: Average or above average intelligence  Capable of independent living  Communication skills  Motivation for treatment/growth  Supportive family/friends   Treatment Modalities: Medication Management, Group therapy, Case management,  1 to 1 session with clinician, Psychoeducation, Recreational therapy.   Physician Treatment Plan for Primary Diagnosis: PTSD (post-traumatic stress disorder) Long Term Goal(s):     Short Term Goals:    Medication Management: Evaluate patient's response, side effects, and tolerance of medication regimen.  Therapeutic Interventions: 1 to 1 sessions, Unit Group sessions and Medication administration.  Evaluation of Outcomes: Not Progressing  Physician Treatment Plan for Secondary Diagnosis: Principal Problem:  PTSD (post-traumatic stress disorder)  Long Term Goal(s):     Short Term Goals:       Medication Management: Evaluate patient's response, side effects, and tolerance of medication regimen.  Therapeutic Interventions: 1 to 1 sessions, Unit Group sessions and Medication administration.  Evaluation of Outcomes: Not Progressing   RN Treatment Plan for Primary Diagnosis: PTSD (post-traumatic stress disorder) Long Term Goal(s): Knowledge of disease and therapeutic regimen to maintain health will improve  Short Term Goals: Ability to remain free from injury will improve, Ability to verbalize frustration and anger appropriately will improve, Ability to demonstrate self-control, Ability to participate in decision making will  improve, Ability to verbalize feelings will improve, Ability to disclose and discuss suicidal ideas, Ability to identify and develop effective coping behaviors will improve, and Compliance with prescribed medications will improve  Medication Management: RN will administer medications as ordered by provider, will assess and evaluate patient's response and provide education to patient for prescribed medication. RN will report any adverse and/or side effects to prescribing provider.  Therapeutic Interventions: 1 on 1 counseling sessions, Psychoeducation, Medication administration, Evaluate responses to treatment, Monitor vital signs and CBGs as ordered, Perform/monitor CIWA, COWS, AIMS and Fall Risk screenings as ordered, Perform wound care treatments as ordered.  Evaluation of Outcomes: Not Progressing   LCSW Treatment Plan for Primary Diagnosis: PTSD (post-traumatic stress disorder) Long Term Goal(s): Safe transition to appropriate next level of care at discharge, Engage patient in therapeutic group addressing interpersonal concerns.  Short Term Goals: Engage patient in aftercare planning with referrals and resources, Increase social support, Increase ability to appropriately verbalize feelings, Increase emotional regulation, Facilitate acceptance of mental health diagnosis and concerns, Facilitate patient progression through stages of change regarding substance use diagnoses and concerns, Identify triggers associated with mental health/substance abuse issues, and Increase skills for wellness and recovery  Therapeutic Interventions: Assess for all discharge needs, 1 to 1 time with Social worker, Explore available resources and support systems, Assess for adequacy in community support network, Educate family and significant other(s) on suicide prevention, Complete Psychosocial Assessment, Interpersonal group therapy.  Evaluation of Outcomes: Not Progressing   Progress in Treatment: Attending  groups: No. Participating in groups: No. Taking medication as prescribed: No. Toleration medication: No.Pt refused medication today  Family/Significant other contact made: No, will contact:  Whoever patient give CSW permission to contact  Patient understands diagnosis: Yes. Discussing patient identified problems/goals with staff: Yes. Medical problems stabilized or resolved: Yes. Denies suicidal/homicidal ideation: Yes. Issues/concerns per patient self-inventory: No.   New problem(s) identified: No, Describe:  None reported   New Short Term/Long Term Goal(s):d  medication stabilization, elimination of SI thoughts, development of comprehensive mental wellness plan.    Patient Goals:  " find a long-term outpatient therapist that has experience with PTSD and combat stress "   Discharge Plan or Barriers: Patient recently admitted. CSW will continue to follow and assess for appropriate referrals and possible discharge planning.    Reason for Continuation of Hospitalization: Anxiety Depression Medication stabilization Other; describe PTSD  Estimated Length of Stay: 3-5 days   Last 3 [place] Suicide Severity Risk Score: Flowsheet Row [facility]27/2023 in [facility] Admission (Discharged) from 11/04/2021 in [place] [facility]  C-SSRS RISK CATEGORY Low Risk Low Risk No Risk       Last PHQ 2/9 Scores:     No data to display  Scribe for Treatment Team: Beather Arbour 06/30/2023 12:38 PM

## 2023-06-30 NOTE — Group Note (Signed)
Date:  06/30/2023 Time:  9:03 PM  Group Topic/Focus:  Wrap-Up Group:   The focus of this group is to help patients review their daily goal of treatment and discuss progress on daily workbooks.    Participation Level:  Active  Participation Quality:  Appropriate, Attentive, Sharing, and Supportive  Affect:  Appropriate  Cognitive:  Appropriate  Insight: Appropriate  Engagement in Group:  Engaged and Supportive  Modes of Intervention:  Discussion and Socialization  Additional Comments:  The patient stated that the gaol for today was to talk and interact with people more. The patient stated that he did achieve that goal and that is the same for for tomorrow. The patient rated their day 10/10. The patient also stated that he had a visit with mom and it was a great visit.   Jake Morgan 06/30/2023, 9:03 PM
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
## 2023-06-30 NOTE — Progress Notes (Signed)
   06/30/23 0018  Psych Admission Type (Psych Patients Only)  Admission Status Voluntary  Psychosocial Assessment  Patient Complaints Sleep disturbance;Anxiety  Eye Contact Brief  Facial Expression Anxious  Affect Anxious  Speech Logical/coherent  Interaction Needy  Motor Activity Fidgety  Appearance/Hygiene Unremarkable  Behavior Characteristics Cooperative;Fidgety;Anxious  Mood Anxious;Depressed  Thought Process  Coherency WDL  Content Blaming self;Blaming others  Delusions WDL  Perception WDL  Hallucination None reported or observed  Judgment Limited  Confusion WDL  Danger to Self  Current suicidal ideation? Denies  Danger to Others  Danger to Others None reported or observed   Pt affect anxious, mood depressed, rated his day a 6/10 and goal was to communicate with peers/staff. Pt states he wants more focused therapy for combat PTSD. Pt received trazodone for sleep as requested, support/encouragement given (a) 15 min checks (r) safety maintained.

## 2023-06-30 NOTE — Progress Notes (Signed)
   06/30/23 0554  15 Minute Checks  Location Bedroom  Visual Appearance Calm  Behavior Composed  Sleep (Behavioral Health Patients Only)  Calculate sleep? (Click Yes once per 24 hr at 0600 safety check) Yes  Documented sleep last 24 hours 5.25

## 2023-06-30 NOTE — Group Note (Signed)
Recreation Therapy Group Note   Group Topic:Problem Solving  Group Date: 06/30/2023 Start Time: 0931 End Time: 1001 Facilitators: Grabiel Schmutz-McCall, LRT,CTRS Location: [address] Dayroom   Goal Area(s) Addresses:  Patient will effectively work with peer towards shared goal.  Patient will identify skills used to make activity successful.  Patient will identify how skills used during activity can be applied to reach post d/c goals.   Group Description: [employer]. In teams of 5-6, patients were given 11 craft pipe cleaners. Using the materials provided, patients were instructed to compete again the opposing team(s) to build the tallest free-standing structure from floor level. The activity was timed; difficulty increased by Clinical research associate as Production designer, theatre/television/film continued.  Systematically resources were removed with additional directions for example, placing one arm behind their back, working in silence, and shape stipulations. LRT facilitated post-activity discussion reviewing team processes and necessary communication skills involved in completion. Patients were encouraged to reflect how the skills utilized, or not utilized, in this activity can be incorporated to positively impact support systems post discharge.   Affect/Mood: Appropriate   Participation Level: Engaged   Participation Quality: Independent   Behavior: Appropriate   Speech/Thought Process: Focused   Insight: Good   Judgement: Good   Modes of Intervention: Problem-solving   Patient Response to Interventions:  Engaged   Education Outcome:  Acknowledges education   Clinical Observations/Individualized Feedback: Pt attended and participated in group session. Pt worked well with peers in completing the activity.     Plan: Continue to engage patient in RT group sessions 2-3x/week.   Mistee Soliman-McCall, LRT,CTRS  06/30/2023 12:39 PM

## 2023-06-30 NOTE — Progress Notes (Signed)
   06/30/23 2157  Psych Admission Type (Psych Patients Only)  Admission Status Voluntary  Psychosocial Assessment  Patient Complaints Anxiety  Eye Contact Fair  Facial Expression Anxious  Affect Appropriate to circumstance  Speech Logical/coherent  Interaction Assertive  Motor Activity Fidgety  Appearance/Hygiene Unremarkable  Behavior Characteristics Appropriate to situation  Mood Anxious;Pleasant  Thought Process  Coherency WDL  Content WDL  Delusions None reported or observed  Perception WDL  Hallucination None reported or observed  Judgment Poor  Confusion None  Danger to Self  Current suicidal ideation? Denies  Agreement Not to Harm Self Yes  Description of Agreement verbal  Danger to Others  Danger to Others None reported or observed
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
## 2023-06-30 NOTE — Progress Notes (Signed)
   06/30/23 1019  Psych Admission Type (Psych Patients Only)  Admission Status Voluntary  Psychosocial Assessment  Patient Complaints Anxiety;Agitation  Eye Contact Fair  Facial Expression Anxious  Affect Anxious  Speech Logical/coherent  Interaction Needy  Motor Activity Fidgety  Appearance/Hygiene Unremarkable  Behavior Characteristics Anxious  Mood Anxious  Thought Process  Coherency WDL  Content Blaming others;Blaming self  Delusions None reported or observed  Perception WDL  Hallucination None reported or observed  Judgment Poor  Confusion None  Danger to Self  Current suicidal ideation? Denies  Agreement Not to Harm Self Yes  Description of Agreement verbal  Danger to Others  Danger to Others None reported or observed   Pt is anxious about receiving treatment here versus the [place] hospital. Pt expresses he is having racing thoughts and exhibits anxiety.
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
## 2023-07-01 DIAGNOSIS — F332 Major depressive disorder, recurrent severe without psychotic features: Secondary | ICD-10-CM | POA: Diagnosis not present

## 2023-07-01 MED ORDER — SERTRALINE HCL 100 MG PO TABS
100.0000 mg | ORAL_TABLET | Freq: Every day | ORAL | Status: DC
  Administered 2023-07-02 – 2023-07-03 (×2): 100 mg via ORAL
  Filled 2023-07-01 (×4): qty 1

## 2023-07-01 NOTE — Progress Notes (Signed)
Adult Psychoeducational Group Note  Date:  07/01/2023 Time:  9:41 PM  Group Topic/Focus:  Wrap-Up Group:   The focus of this group is to help patients review their daily goal of treatment and discuss progress on daily workbooks.  Participation Level:  Active  Participation Quality:  Appropriate  Affect:  Appropriate  Cognitive:  Appropriate  Insight: Appropriate  Engagement in Group:  Engaged  Modes of Intervention:  Discussion and Support  Additional Comments:  Pt states feeling frustrated today. Pt states feeling ignored by staff. Pt states wanting to express his concerns to [facility]. AC was notified.  Estephania Licciardi Katrinka Blazing 07/01/2023, 9:41 PM

## 2023-07-01 NOTE — BHH Group Notes (Signed)
LCSW Wellness Group Note   07/01/2023 1:00pm  Type of Group and Topic: Psychoeducational Group:  Wellness  Participation Level:  Active  Description of Group  Wellness group introduces the topic and its focus on developing healthy habits across the spectrum and its relationship to a decrease in hospital admissions.  Six areas of wellness are discussed: physical, social spiritual, intellectual, occupational, and emotional.  Patients are asked to consider their current wellness habits and to identify areas of wellness where they are interested and able to focus on improvements.    Therapeutic Goals Patients will understand components of wellness and how they can positively impact overall health.  Patients will identify areas of wellness where they have developed good habits. Patients will identify areas of wellness where they would like to make improvements.    Summary of Patient Progress: pt attentive in group, active in group discussion, read wellness area out loud to the group.  Pt identified intellectual and physical as wellness areas of strength and emotional as a wellness areas that needs work.       Therapeutic Modalities: Cognitive Behavioral Therapy Psychoeducation    Lorri Frederick, LCSW

## 2023-07-01 NOTE — Group Note (Signed)
Date:  07/01/2023 Time:  4:26 PM  Group Topic/Focus:  Dimensions of Wellness:   The focus of this group is to introduce the topic of wellness and discuss the role each dimension of wellness plays in total health.    Participation Level:  Active  Participation Quality:  Attentive  Affect:  Appropriate  Cognitive:  Appropriate  Insight: Appropriate  Engagement in Group:  Engaged  Modes of Intervention:  Discussion  Additional Comments:   Pt attended and participated in the Social Wellness group.  Jake Morgan 07/01/2023, 4:26 PM

## 2023-07-01 NOTE — Group Note (Signed)
Date:  07/01/2023 Time:  11:21 AM  Group Topic/Focus:  Goals Group:   The focus of this group is to help patients establish daily goals to achieve during treatment and discuss how the patient can incorporate goal setting into their daily lives to aide in recovery. Orientation:   The focus of this group is to educate the patient on the purpose and policies of crisis stabilization and provide a format to answer questions about their admission.  The group details unit policies and expectations of patients while admitted.    Participation Level:  Active  Participation Quality:  Appropriate  Affect:  Appropriate  Cognitive:  Appropriate  Insight: Appropriate  Engagement in Group:  Engaged  Modes of Intervention:  Discussion, Orientation, and Rapport Building  Additional Comments:   Pt attended and participated in the Orientation/Goals group. Pt goal is to to be okay with who he is and to improve daily.  Jake Morgan 07/01/2023, 11:21 AM

## 2023-07-01 NOTE — Plan of Care (Signed)
  Problem: Education: Goal: Knowledge of [facility] Education information/materials will improve Outcome: Progressing Goal: Emotional status will improve Outcome: Progressing Goal: Mental status will improve Outcome: Progressing Goal: Verbalization of understanding the information provided will improve Outcome: Progressing   Problem: Activity: Goal: Interest or engagement in activities will improve Outcome: Progressing   Problem: Coping: Goal: Ability to verbalize frustrations and anger appropriately will improve Outcome: Progressing Goal: Ability to demonstrate self-control will improve Outcome: Progressing   Problem: Health Behavior/Discharge Planning: Goal: Identification of resources available to assist in meeting health care needs will improve Outcome: Progressing Goal: Compliance with treatment plan for underlying cause of condition will improve Outcome: Progressing   Problem: Physical Regulation: Goal: Ability to maintain clinical measurements within normal limits will improve Outcome: Progressing   Problem: Education: Goal: Knowledge of [facility] Education information/materials will improve Outcome: Progressing

## 2023-07-01 NOTE — Plan of Care (Signed)
  Problem: Education: Goal: Knowledge of  General Education information/materials will improve Outcome: Progressing   Problem: Education: Goal: Verbalization of understanding the information provided will improve Outcome: Progressing

## 2023-07-01 NOTE — B[facility] Group Notes (Signed)
[facility] Group Notes:  (Nursing/MHT/Case Management/Adjunct)  Date:  07/01/2023  Time:  5:46 PM  Type of Therapy:  Nurse Education  Participation Level:  Active  Participation Quality:  Attentive, Sharing, and Supportive  Affect:  Appropriate  Cognitive:  Alert, Appropriate, and Oriented  Insight:  Appropriate and Good  Engagement in Group:  Engaged  Modes of Intervention:  Discussion, Education, Exploration, and Support  Summary of Progress/Problems: Group about Maslow's Hierarchy of Needs and how needs relate to mental health and stability. Healthy discussion about ways to improve self care and positive relationships.   Karren Burly 07/01/2023, 5:46 PM

## 2023-07-01 NOTE — BHH Suicide Risk Assessment (Signed)
[facility] INPATIENT:  Family/Significant Other Suicide Prevention Education  Suicide Prevention Education:  Education Completed; Linzie Brunsvold, wife, has been identified by the patient as the family member/significant other with whom the patient will be residing, and identified as the person(s) who will aid the patient in the event of a mental health crisis (suicidal ideations/suicide attempt).  With written consent from the patient, the family member/significant other has been provided the following suicide prevention education, prior to the and/or following the discharge of the patient.  The suicide prevention education provided includes the following: Suicide risk factors Suicide prevention and interventions National Suicide Hotline telephone number [facility] assessment telephone number [facility] Emergency Assistance 911 [facility] and/or Residential Mobile Crisis Unit telephone number  Request made of family/significant other to: Remove weapons (e.g., guns, rifles, knives), all items previously/currently identified as safety concern.  Per Jem, there are guns in the home and she is going to take these guns to the home of a friend tomorrow. Remove drugs/medications (over-the-counter, prescriptions, illicit drugs), all items previously/currently identified as a safety concern.  The family member/significant other verbalizes understanding of the suicide prevention education information provided.  The family member/significant other agrees to remove the items of safety concern listed above.   Jem reports pt has a way of getting upset about events that he has no control over, such as the war in [place], and gets angry and depressed due to his lack of control.  Pt also gets "fatalistic" in these situations and feels like "there is no point of going on in life".  Jem has been in contact with Carissa at [facility], [phone], part of the suicide prevention team.  She may be able to help  with scheduling follow up there.  Lorri Frederick 07/01/2023, 3:44 PM

## 2023-07-01 NOTE — Progress Notes (Signed)
[facility] MD Progress Note  07/01/2023 2:07 PM Jake Morgan  MRN:  [id number]  Principal Problem: MDD (major depressive disorder), recurrent severe, without psychosis (HCC) Diagnosis: Principal Problem:   MDD (major depressive disorder), recurrent severe, without psychosis (HCC) Active Problems:   PTSD (post-traumatic stress disorder)   GAD (generalized anxiety disorder)   Nicotine use disorder   Tetrahydrocannabinol (THC) use disorder, mild, abuse  Reason For Admission: Jake Morgan is a 47 yo CM with prior mental health diagnoses of MDD & PTSD who presented to the [facility] on 8/6 with worsening depressive symptoms, PTSD type symptoms & passive SI with no plan. Pt was transferred voluntarily to this hospital on 8/7 for treatment and stabilization of his mental status.   24 hr chart review: V/S WNL. Attending unit group sessions, interactive with peers, med compliant. Required Trazodone 50 mg last night for sleep & also required Hydroxyzine 25 mg last night for anxiety.   Patient assessment note:  Pt with flat affect and depressed mood, attention to personal hygiene and grooming is fair, eye contact is good, speech is clear & coherent. Thought contents are organized and logical, and pt currently denies SI/HI/AVH or paranoia. There is no evidence of delusional thoughts.    Patient reports that his sleep is improving, and that he slept for 2 hrs more than he typically sleeps at home, states that his anxiety is resolving, he is tending to personal hygiene needs and is in his own clothes today unlike in scrubs like he was yesterday. He reports that coming to the hospital has been benefits, and is looking forward to getting his benefits with the [facility] jump started so that he can start therapy sessions so as to start the healing process of the trauma aftermath from the time served in the [employer].   We are continuing medications as listed below, and the plan is to discharge patient most likely on Tuesday  after outpatient appts have been completed and safety planning done with wife. We will increase Zoloft to 100 mg starting tomorrow morning for management of depressive symptoms. Pt has verbalized wanting to take Gabapentin only once daily to last him 24 hrs. Will verify with pharmacy if they carry an extended release of this medications and will go from there.  Total Time spent with patient: 45 minutes  Past Psychiatric History: See H and P  Past Medical History:  Past Medical History:  Diagnosis Date   COPD (chronic obstructive pulmonary disease) (HCC)    PTSD (post-traumatic stress disorder)    was in the service   Spontaneous pneumothorax 02/19/2005    Past Surgical History:  Procedure Laterality Date   APPENDECTOMY  2014   ORIF ANKLE FRACTURE Left 11/04/2021   Procedure: OPEN REDUCTION TRIMALLEOLAR ANKLE FRACTURE WITH POSTERIOR FIXATION, SYNDESMOSIS;  Surgeon: Terance Hart, MD;  Location: [facility];  Service: Orthopedics;  Laterality: Left;   Family History: History reviewed. No pertinent family history. Family Psychiatric  History: See H & P Social History:  Social History   Substance and Sexual Activity  Alcohol Use Yes   Alcohol/week: 2.0 standard drinks of alcohol   Types: 2 Cans of beer per week   Comment: 3 times a week     Social History   Substance and Sexual Activity  Drug Use Never    Social History   Socioeconomic History   Marital status: Married    Spouse name: Not on file   Number of children: Not on file  Years of education: Not on file   Highest education level: Not on file  Occupational History   Not on file  Tobacco Use   Smoking status: Former    Types: Cigarettes   Smokeless tobacco: Never  Vaping Use   Vaping status: Every Day   Substances: Nicotine  Substance and Sexual Activity   Alcohol use: Yes    Alcohol/week: 2.0 standard drinks of alcohol    Types: 2 Cans of beer per week    Comment: 3 times a week   Drug use: Never    Sexual activity: Yes  Other Topics Concern   Not on file  Social History Narrative   Not on file   Social Determinants of Health   Financial Resource Strain: Not on file  Food Insecurity: Patient Declined (06/29/2023)   Hunger Vital Sign    Worried About Running Out of Food in the Last Year: Patient declined    Ran Out of Food in the Last Year: Patient declined  Transportation Needs: Patient Declined (06/29/2023)   PRAPARE - Administrator, Civil Service (Medical): Patient declined    Lack of Transportation (Non-Medical): Patient declined  Physical Activity: Not on file  Stress: Not on file  Social Connections: Not on file   Sleep: Fair  Appetite:  Good  Current Medications: Current Facility-Administered Medications  Medication Dose Route Frequency Provider Last Rate Last Admin   acetaminophen (TYLENOL) tablet 650 mg  650 mg Oral Q6H PRN Lenox Ponds, NP       alum & mag hydroxide-simeth (MAALOX/MYLANTA) 200-200-20 MG/5ML suspension 30 mL  30 mL Oral Q4H PRN Lenox Ponds, NP       diphenhydrAMINE (BENADRYL) capsule 50 mg  50 mg Oral TID PRN Lenox Ponds, NP       Or   diphenhydrAMINE (BENADRYL) injection 50 mg  50 mg Intramuscular TID PRN Lenox Ponds, NP       feeding supplement (ENSURE ENLIVE / ENSURE PLUS) liquid 237 mL  237 mL Oral BID BM Massengill, Harrold Donath, MD   237 mL at 07/01/23 0800   gabapentin (NEURONTIN) capsule 200 mg  200 mg Oral TID Starleen Blue, NP   200 mg at 07/01/23 0800   haloperidol (HALDOL) tablet 5 mg  5 mg Oral TID PRN Lenox Ponds, NP       Or   haloperidol lactate (HALDOL) injection 5 mg  5 mg Intramuscular TID PRN Lenox Ponds, NP       hydrOXYzine (ATARAX) tablet 25 mg  25 mg Oral TID PRN Starleen Blue, NP   25 mg at 06/30/23 2238   LORazepam (ATIVAN) tablet 2 mg  2 mg Oral TID PRN Lenox Ponds, NP       Or   LORazepam (ATIVAN) injection 2 mg  2 mg Intramuscular TID PRN Lenox Ponds, NP       magnesium  hydroxide (MILK OF MAGNESIA) suspension 30 mL  30 mL Oral Daily PRN Lenox Ponds, NP       nicotine polacrilex (NICORETTE) gum 2 mg  2 mg Oral Q1H PRN Lenox Ponds, NP   2 mg at 07/01/23 1255   [START ON 07/02/2023] sertraline (ZOLOFT) tablet 100 mg  100 mg Oral Daily Katilyn Miltenberger, Tyler Aas, NP       traZODone (DESYREL) tablet 50 mg  50 mg Oral QHS PRN Lenox Ponds, NP   50 mg at 06/30/23 2238    Lab Results:  Results for orders placed or performed during the hospital encounter of 06/29/23 (from the past 48 hour(s))  TSH     Status: None   Collection Time: 07/01/23  6:37 AM  Result Value Ref Range   TSH 2.082 0.350 - 4.500 uIU/mL    Comment: Performed by a 3rd Generation assay with a functional sensitivity of <=0.01 uIU/mL. Performed at [facility], [address] [place], [place] [address]   Lipid panel     Status: Abnormal   Collection Time: 07/01/23  6:37 AM  Result Value Ref Range   Cholesterol 174 0 - 200 mg/dL   Triglycerides 85 <102 mg/dL   HDL 54 >72 mg/dL   Total CHOL/HDL Ratio 3.2 RATIO   VLDL 17 0 - 40 mg/dL   LDL Cholesterol 536 (H) 0 - 99 mg/dL    Comment:        Total Cholesterol/HDL:CHD Risk Coronary Heart Disease Risk Table                     Men   Women  1/2 Average Risk   3.4   3.3  Average Risk       5.0   4.4  2 X Average Risk   9.6   7.1  3 X Average Risk  23.4   11.0        Use the calculated Patient Ratio above and the CHD Risk Table to determine the patient's CHD Risk.        ATP III CLASSIFICATION (LDL):  <100     mg/dL   Optimal  644-034  mg/dL   Near or Above                    Optimal  130-159  mg/dL   Borderline  742-595  mg/dL   High  >638     mg/dL   Very High Performed at [facility], [address], [place], [place] [address]   Hemoglobin A1c     Status: None   Collection Time: 07/01/23  6:37 AM  Result Value Ref Range   Hgb A1c MFr Bld 5.0 4.8 - 5.6 %    Comment: (NOTE) Pre diabetes:           5.7%-6.4%  Diabetes:              >6.4%  Glycemic control for   <7.0% adults with diabetes    Mean Plasma Glucose 96.8 mg/dL    Comment: Performed at [facility], [address], [place], [place] [address]  VITAMIN D 25 Hydroxy (Vit-D Deficiency, Fractures)     Status: None   Collection Time: 07/01/23  6:37 AM  Result Value Ref Range   Vit D, 25-Hydroxy 48.81 30 - 100 ng/mL    Comment: (NOTE) Vitamin D deficiency has been defined by the Institute of Medicine  and an Endocrine Society practice guideline as a level of serum 25-OH  vitamin D less than 20 ng/mL (1,2). The Endocrine Society went on to  further define vitamin D insufficiency as a level between 21 and 29  ng/mL (2).  1. IOM (Institute of Medicine). 2010. Dietary reference intakes for  calcium and D. [place]: [employer]. 2. Holick MF, Binkley Pringle, Bischoff-Ferrari HA, et al. Evaluation,  treatment, and prevention of vitamin D deficiency: an Endocrine  Society clinical practice guideline, JCEM. 2011 Jul; 96(7): 1911-30.  Performed at [facility],  [address], [place], [place] [address]     Blood Alcohol level:  Lab Results  Component Value Date   [facility] <10 06/27/2023   ETH <10 10/24/2021    Metabolic Disorder Labs: Lab Results  Component Value Date   HGBA1C 5.0 07/01/2023   MPG 96.8 07/01/2023   No results found for: "PROLACTIN" Lab Results  Component Value Date   CHOL 174 07/01/2023   TRIG 85 07/01/2023   HDL 54 07/01/2023   CHOLHDL 3.2 07/01/2023   VLDL 17 07/01/2023   LDLCALC 103 (H) 07/01/2023    Physical Findings: AIMS:  , ,  ,  ,    CIWA:    COWS:     Musculoskeletal: Strength & Muscle Tone: within normal limits Gait & Station: normal Patient leans: N/A  Psychiatric Specialty Exam:  Presentation  General Appearance:  Appropriate for Environment; Casual  Eye Contact: Fair  Speech: Clear and Coherent  Speech  Volume: Normal  Handedness: Right   Mood and Affect  Mood: Depressed  Affect: Appropriate   Thought Process  Thought Processes: Coherent  Descriptions of Associations:Intact  Orientation:Full (Time, Place and Person)  Thought Content:Logical  History of Schizophrenia/Schizoaffective disorder:No data recorded Duration of Psychotic Symptoms:No data recorded Hallucinations:Hallucinations: None  Ideas of Reference:None  Suicidal Thoughts:Suicidal Thoughts: No  Homicidal Thoughts:Homicidal Thoughts: No   Sensorium  Memory: Immediate Good  Judgment: Fair  Insight: Good   Executive Functions  Concentration: Good  Attention Span: Good  Recall: Good  Fund of Knowledge: Good  Language: Good   Psychomotor Activity  Psychomotor Activity: Psychomotor Activity: Normal   Assets  Assets: Communication Skills   Sleep  Sleep: Sleep: Fair    Physical Exam: Physical Exam Vitals reviewed.  HENT:     Nose: Nose normal.  Eyes:     Pupils: Pupils are equal, round, and reactive to light.  Musculoskeletal:        General: Normal range of motion.     Cervical back: Normal range of motion.  Neurological:     Mental Status: He is oriented to person, place, and time.    Review of Systems  Constitutional: Negative.   HENT: Negative.    Eyes: Negative.   Respiratory: Negative.    Cardiovascular: Negative.   Gastrointestinal: Negative.   Genitourinary: Negative.   Musculoskeletal: Negative.   Skin: Negative.   Neurological:  Negative for dizziness.  Psychiatric/Behavioral:  Positive for depression and substance abuse (occasional THC use as pr patient). Negative for hallucinations, memory loss and suicidal ideas. The patient is nervous/anxious and has insomnia.    Blood pressure 105/84, pulse 78, temperature (!) 97.5 F (36.4 C), temperature source Oral, resp. rate 17, height 6\' 1"  (1.854 m), weight 65.1 kg, SpO2 100%. Body mass index is 18.95  kg/m.   Treatment Plan Summary: Treatment Plan Summary: Daily contact with patient to assess and evaluate symptoms and progress in treatment and Medication management   Safety and Monitoring: Voluntary admission to inpatient psychiatric unit for safety, stabilization and treatment Daily contact with patient to assess and evaluate symptoms and progress in treatment Patient's case to be discussed in multi-disciplinary team meeting Observation Level : q15 minute checks Vital signs: q12 hours Precautions: Safety   Long Term Goal(s): Improvement in symptoms so as ready for discharge   Short Term Goals: Ability to identify changes in lifestyle to reduce recurrence of condition will improve, Ability to verbalize feelings will improve, Ability to disclose and discuss suicidal ideas, Ability to demonstrate self-control  will improve, Ability to identify and develop effective coping behaviors will improve, Ability to maintain clinical measurements within normal limits will improve, and Compliance with prescribed medications will improve   Diagnoses Principal Problem:   MDD (major depressive disorder), recurrent severe, without psychosis (HCC) Active Problems:   PTSD (post-traumatic stress disorder)   GAD (generalized anxiety disorder)   Nicotine use disorder   Tetrahydrocannabinol (THC) use disorder, mild, abuse   Medications -Continue gabapentin 200 mg 3 times daily for GAD with panic symptoms -Increase Zoloft to 100 mg for depressive symptoms starting 8/11 -Continue hydroxyzine 25 mg 3 times daily as needed for depressive symptoms -Continue agitation protocol: Haldol/Ativan/Benadryl 3 times daily as needed for agitation -Continue Nicorette gum as needed for nicotine addiction -Offered Prazosin for nightmares and refused   Labs reviewed  Other PRNS -Continue Tylenol 650 mg every 6 hours PRN for mild pain -Continue Maalox 30 mg every 4 hrs PRN for indigestion -Continue Milk of Magnesia  as needed every 6 hrs for constipation   Labs reviewed: Ordered vitamin D levels, TSH, lipid panel, hemoglobin A1c.   Discharge Planning: Social work and case management to assist with discharge planning and identification of hospital follow-up needs prior to discharge Estimated LOS: 5-7 days Discharge Concerns: Need to establish a safety plan; Medication compliance and effectiveness Discharge Goals: Return home with outpatient referrals for mental health follow-up including medication management/psychotherapy   I certify that inpatient services furnished can reasonably be expected to improve the patient's condition.      Starleen Blue, NP 07/01/2023, 2:07 PM

## 2023-07-01 NOTE — Progress Notes (Signed)
Nursing Note: 0700-1900    Goal for today: "Try to help others, talk and listen to others." Pt reports that he slept poorly last night due to his roommate snoring loudly, "It's ok, it was fine." Appetite is fair, pt doesn't mind drinking Ensure shakes and is tolerating prescribed medication without side effects.  Rates both anxiety and hopelessness 0/10 and depression 1/10 this am.  Denies A/V hallucinations and is able to verbally contract for safety. Pt pleasant and cooperative, verbalizes appreciation for care provided. 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 moved into another room to help aid  sleep tonight. Encouraged to verbalize needs and concerns, active listening and support provided.  Continued Q 15 minute safety checks.  Observed active participation in group settings.    07/01/23 0800  Psych Admission Type (Psych Patients Only)  Admission Status Voluntary  Psychosocial Assessment  Patient Complaints Anxiety  Eye Contact Fair  Facial Expression Anxious  Affect Appropriate to circumstance  Speech Logical/coherent  Interaction Assertive  Motor Activity Fidgety  Appearance/Hygiene Unremarkable  Behavior Characteristics Appropriate to situation  Mood Anxious;Pleasant  Thought Process  Coherency WDL  Content WDL  Delusions None reported or observed  Perception WDL  Hallucination None reported or observed  Judgment Impaired  Confusion None  Danger to Self  Current suicidal ideation? Denies  Agreement Not to Harm Self Yes  Description of Agreement Verbal  Danger to Others  Danger to Others None reported or observed

## 2023-07-02 DIAGNOSIS — F332 Major depressive disorder, recurrent severe without psychotic features: Secondary | ICD-10-CM | POA: Diagnosis not present

## 2023-07-02 MED ORDER — GABAPENTIN 300 MG PO CAPS
300.0000 mg | ORAL_CAPSULE | Freq: Three times a day (TID) | ORAL | Status: DC
  Administered 2023-07-02 – 2023-07-03 (×3): 300 mg via ORAL
  Filled 2023-07-02 (×9): qty 1

## 2023-07-02 NOTE — Progress Notes (Signed)
[facility] MD Progress Note  07/02/2023 2:25 PM Jake Morgan  MRN:  [id number]  Principal Problem: MDD (major depressive disorder), recurrent severe, without psychosis (HCC) Diagnosis: Principal Problem:   MDD (major depressive disorder), recurrent severe, without psychosis (HCC) Active Problems:   PTSD (post-traumatic stress disorder)   GAD (generalized anxiety disorder)   Nicotine use disorder   Tetrahydrocannabinol (THC) use disorder, mild, abuse  Reason For Admission: Jake Morgan is a 47 yo CM with prior mental health diagnoses of MDD & PTSD who presented to the [facility] on 8/6 with worsening depressive symptoms, PTSD type symptoms & passive SI with no plan. Pt was transferred voluntarily to this hospital on 8/7 for treatment and stabilization of his mental status.   24 hr chart review: V/S are continuing to be WNL.  Pt is attending unit group sessions, is interactive with peers & staff. He is med compliant. Required Trazodone 50 mg last night for sleep & also required Hydroxyzine 25 mg last night for anxiety. No behavioral episodes reported or noted last night.  Patient assessment note:  On assessment today, the pt reports that their mood is euthymic, improved since admission, and stable. Denies feeling down, depressed, or sad.  Reports that anxiety symptoms are more manageable level than at admission, but states could benefit from having an increased dose of the Gabapentin for management of anxiety. Pt inquired about drinking an occasional beer while taking this medication, and educated against this. Pt educated that drinking while taking this medication can lead to more impairment as both hit same receptors. Sleep is stable. Appetite is stable.  Concentration is without complaint.  Energy level is adequate. Denies having any suicidal thoughts. Denies having any suicidal intent and plan.  Denies having any HI.  Denies having psychotic symptoms.   Denies having side effects to current  psychiatric medications.Agreeable to an increase in the dose of Gabapentin to 300 mg TID for anxiety, and continuing other medications as listed below. Discussed discharge planning; Educated pt that we will consider discharging him tomorrow, 8/12 if [facility]. Pt states that wife already made those appts, but Clinical research associate educated him on the fact that we will have to ensure that the appts are completed and also complete safety planning with wife, to which patient verbalized understanding.    Total Time spent with patient: 45 minutes  Past Psychiatric History: See H and P  Past Medical History:  Past Medical History:  Diagnosis Date   COPD (chronic obstructive pulmonary disease) (HCC)    PTSD (post-traumatic stress disorder)    was in the service   Spontaneous pneumothorax 02/19/2005    Past Surgical History:  Procedure Laterality Date   APPENDECTOMY  2014   ORIF ANKLE FRACTURE Left 11/04/2021   Procedure: OPEN REDUCTION TRIMALLEOLAR ANKLE FRACTURE WITH POSTERIOR FIXATION, SYNDESMOSIS;  Surgeon: Terance Hart, MD;  Location: [facility] OR;  Service: Orthopedics;  Laterality: Left;   Family History: History reviewed. No pertinent family history. Family Psychiatric  History: See H & P Social History:  Social History   Substance and Sexual Activity  Alcohol Use Yes   Alcohol/week: 2.0 standard drinks of alcohol   Types: 2 Cans of beer per week   Comment: 3 times a week     Social History   Substance and Sexual Activity  Drug Use Never    Social History   Socioeconomic History   Marital status: Married  Spouse name: Not on file   Number of children: Not on file   Years of education: Not on file   Highest education level: Not on file  Occupational History   Not on file  Tobacco Use   Smoking status: Former    Types: Cigarettes   Smokeless tobacco: Never  Vaping Use   Vaping status: Every Day   Substances:  Nicotine  Substance and Sexual Activity   Alcohol use: Yes    Alcohol/week: 2.0 standard drinks of alcohol    Types: 2 Cans of beer per week    Comment: 3 times a week   Drug use: Never   Sexual activity: Yes  Other Topics Concern   Not on file  Social History Narrative   Not on file   Social Determinants of Health   Financial Resource Strain: Not on file  Food Insecurity: Patient Declined (06/29/2023)   Hunger Vital Sign    Worried About Running Out of Food in the Last Year: Patient declined    Ran Out of Food in the Last Year: Patient declined  Transportation Needs: Patient Declined (06/29/2023)   PRAPARE - Administrator, Civil Service (Medical): Patient declined    Lack of Transportation (Non-Medical): Patient declined  Physical Activity: Not on file  Stress: Not on file  Social Connections: Not on file   Sleep: Fair  Appetite:  Good  Current Medications: Current Facility-Administered Medications  Medication Dose Route Frequency Provider Last Rate Last Admin   acetaminophen (TYLENOL) tablet 650 mg  650 mg Oral Q6H PRN Lenox Ponds, NP       alum & mag hydroxide-simeth (MAALOX/MYLANTA) 200-200-20 MG/5ML suspension 30 mL  30 mL Oral Q4H PRN Lenox Ponds, NP       diphenhydrAMINE (BENADRYL) capsule 50 mg  50 mg Oral TID PRN Lenox Ponds, NP       Or   diphenhydrAMINE (BENADRYL) injection 50 mg  50 mg Intramuscular TID PRN Lenox Ponds, NP       feeding supplement (ENSURE ENLIVE / ENSURE PLUS) liquid 237 mL  237 mL Oral BID BM Massengill, Harrold Donath, MD   237 mL at 07/02/23 1045   gabapentin (NEURONTIN) capsule 300 mg  300 mg Oral TID Starleen Blue, NP       haloperidol (HALDOL) tablet 5 mg  5 mg Oral TID PRN Lenox Ponds, NP       Or   haloperidol lactate (HALDOL) injection 5 mg  5 mg Intramuscular TID PRN Lenox Ponds, NP       hydrOXYzine (ATARAX) tablet 25 mg  25 mg Oral TID PRN Starleen Blue, NP   25 mg at 07/01/23 2153   LORazepam  (ATIVAN) tablet 2 mg  2 mg Oral TID PRN Lenox Ponds, NP       Or   LORazepam (ATIVAN) injection 2 mg  2 mg Intramuscular TID PRN Lenox Ponds, NP       magnesium hydroxide (MILK OF MAGNESIA) suspension 30 mL  30 mL Oral Daily PRN Lenox Ponds, NP       nicotine polacrilex (NICORETTE) gum 2 mg  2 mg Oral Q1H PRN Lenox Ponds, NP   2 mg at 07/02/23 1405   sertraline (ZOLOFT) tablet 100 mg  100 mg Oral Daily Starleen Blue, NP   100 mg at 07/02/23 0817   traZODone (DESYREL) tablet 50 mg  50 mg Oral QHS PRN Lenox Ponds, NP  50 mg at 07/01/23 2153    Lab Results:  Results for orders placed or performed during the hospital encounter of 06/29/23 (from the past 48 hour(s))  TSH     Status: None   Collection Time: 07/01/23  6:37 AM  Result Value Ref Range   TSH 2.082 0.350 - 4.500 uIU/mL    Comment: Performed by a 3rd Generation assay with a functional sensitivity of <=0.01 uIU/mL. Performed at [facility], [address], [place], [place] [address]   Lipid panel     Status: Abnormal   Collection Time: 07/01/23  6:37 AM  Result Value Ref Range   Cholesterol 174 0 - 200 mg/dL   Triglycerides 85 <604 mg/dL   HDL 54 >54 mg/dL   Total CHOL/HDL Ratio 3.2 RATIO   VLDL 17 0 - 40 mg/dL   LDL Cholesterol 098 (H) 0 - 99 mg/dL    Comment:        Total Cholesterol/HDL:CHD Risk Coronary Heart Disease Risk Table                     Men   Women  1/2 Average Risk   3.4   3.3  Average Risk       5.0   4.4  2 X Average Risk   9.6   7.1  3 X Average Risk  23.4   11.0        Use the calculated Patient Ratio above and the CHD Risk Table to determine the patient's CHD Risk.        ATP III CLASSIFICATION (LDL):  <100     mg/dL   Optimal  119-147  mg/dL   Near or Above                    Optimal  130-159  mg/dL   Borderline  829-562  mg/dL   High  >130     mg/dL   Very High Performed at [facility], [address], [place], [place]  [address]   Hemoglobin A1c     Status: None   Collection Time: 07/01/23  6:37 AM  Result Value Ref Range   Hgb A1c MFr Bld 5.0 4.8 - 5.6 %    Comment: (NOTE) Pre diabetes:          5.7%-6.4%  Diabetes:              >6.4%  Glycemic control for   <7.0% adults with diabetes    Mean Plasma Glucose 96.8 mg/dL    Comment: Performed at [facility], [address] [place], [place] [address]  VITAMIN D 25 Hydroxy (Vit-D Deficiency, Fractures)     Status: None   Collection Time: 07/01/23  6:37 AM  Result Value Ref Range   Vit D, 25-Hydroxy 48.81 30 - 100 ng/mL    Comment: (NOTE) Vitamin D deficiency has been defined by the Institute of Medicine  and an Endocrine Society practice guideline as a level of serum 25-OH  vitamin D less than 20 ng/mL (1,2). The Endocrine Society went on to  further define vitamin D insufficiency as a level between 21 and 29  ng/mL (2).  1. IOM (Institute of Medicine). 2010. Dietary reference intakes for  calcium and D. [place]: [employer]. 2. Holick MF, Binkley Lancaster, Bischoff-Ferrari HA, et al. Evaluation,  treatment, and prevention of vitamin D deficiency: an Endocrine  Society clinical practice guideline, JCEM.  2011 Jul; 96(7): 1911-30.  Performed at [facility], [address], [place], [place] [address]     Blood Alcohol level:  Lab Results  Component Value Date   [facility] <10 06/27/2023   ETH <10 10/24/2021    Metabolic Disorder Labs: Lab Results  Component Value Date   HGBA1C 5.0 07/01/2023   MPG 96.8 07/01/2023   No results found for: "PROLACTIN" Lab Results  Component Value Date   CHOL 174 07/01/2023   TRIG 85 07/01/2023   HDL 54 07/01/2023   CHOLHDL 3.2 07/01/2023   VLDL 17 07/01/2023   LDLCALC 103 (H) 07/01/2023    Physical Findings: AIMS:  , ,  ,  ,    CIWA:    COWS:     Musculoskeletal: Strength & Muscle Tone: within normal limits Gait & Station: normal Patient leans: N/A  Psychiatric  Specialty Exam:  Presentation  General Appearance:  Appropriate for Environment; Fairly Groomed  Eye Contact: Fair  Speech: Clear and Coherent  Speech Volume: Normal  Handedness: Right   Mood and Affect  Mood: Anxious  Affect: Appropriate   Thought Process  Thought Processes: Coherent  Descriptions of Associations:Intact  Orientation:Full (Time, Place and Person)  Thought Content:Logical  History of Schizophrenia/Schizoaffective disorder:No data recorded Duration of Psychotic Symptoms:No data recorded Hallucinations:Hallucinations: None  Ideas of Reference:None  Suicidal Thoughts:Suicidal Thoughts: No  Homicidal Thoughts:Homicidal Thoughts: No   Sensorium  Memory: Immediate Good  Judgment: Fair  Insight: Fair   Art therapist  Concentration: Good  Attention Span: Good  Recall: Good  Fund of Knowledge: Good  Language: Good   Psychomotor Activity  Psychomotor Activity: Psychomotor Activity: Normal   Assets  Assets: Social Support   Sleep  Sleep: Sleep: Good    Physical Exam: Physical Exam Vitals reviewed.  HENT:     Nose: Nose normal.  Eyes:     Pupils: Pupils are equal, round, and reactive to light.  Musculoskeletal:        General: Normal range of motion.     Cervical back: Normal range of motion.  Neurological:     Mental Status: He is oriented to person, place, and time.    Review of Systems  Constitutional: Negative.   HENT: Negative.    Eyes: Negative.   Respiratory: Negative.    Cardiovascular: Negative.   Gastrointestinal: Negative.   Genitourinary: Negative.   Musculoskeletal: Negative.   Skin: Negative.   Neurological:  Negative for dizziness.  Psychiatric/Behavioral:  Positive for depression and substance abuse (occasional THC use as pr patient). Negative for hallucinations, memory loss and suicidal ideas. The patient is nervous/anxious and has insomnia.    Blood pressure 119/62, pulse  91, temperature (!) 97.5 F (36.4 C), temperature source Oral, resp. rate 17, height 6\' 1"  (1.854 m), weight 65.1 kg, SpO2 100%. Body mass index is 18.95 kg/m.   Treatment Plan Summary: Treatment Plan Summary: Daily contact with patient to assess and evaluate symptoms and progress in treatment and Medication management   Safety and Monitoring: Voluntary admission to inpatient psychiatric unit for safety, stabilization and treatment Daily contact with patient to assess and evaluate symptoms and progress in treatment Patient's case to be discussed in multi-disciplinary team meeting Observation Level : q15 minute checks Vital signs: q12 hours Precautions: Safety   Long Term Goal(s): Improvement in symptoms so as ready for discharge   Short Term Goals: Ability to identify changes in lifestyle to reduce recurrence of condition will improve, Ability to verbalize feelings will  improve, Ability to disclose and discuss suicidal ideas, Ability to demonstrate self-control will improve, Ability to identify and develop effective coping behaviors will improve, Ability to maintain clinical measurements within normal limits will improve, and Compliance with prescribed medications will improve   Diagnoses Principal Problem:   MDD (major depressive disorder), recurrent severe, without psychosis (HCC) Active Problems:   PTSD (post-traumatic stress disorder)   GAD (generalized anxiety disorder)   Nicotine use disorder   Tetrahydrocannabinol (THC) use disorder, mild, abuse   Medications -Increase gabapentin to 300 mg 3 times daily for GAD with panic symptoms -Continue Zoloft 100 mg for depressive symptoms -Continue hydroxyzine 25 mg 3 times daily as needed for depressive symptoms -Continue agitation protocol: Haldol/Ativan/Benadryl 3 times daily as needed for agitation -Continue Nicorette gum as needed for nicotine addiction -Offered Prazosin for nightmares and refused   Labs reviewed  Other  PRNS -Continue Tylenol 650 mg every 6 hours PRN for mild pain -Continue Maalox 30 mg every 4 hrs PRN for indigestion -Continue Milk of Magnesia as needed every 6 hrs for constipation   Labs reviewed: vitamin D levels, TSH, lipid panel, hemoglobin A1c all WNL.   Discharge Planning: Social work and case management to assist with discharge planning and identification of hospital follow-up needs prior to discharge Estimated LOS: 5-7 days Discharge Concerns: Need to establish a safety plan; Medication compliance and effectiveness Discharge Goals: Return home with outpatient referrals for mental health follow-up including medication management/psychotherapy   I certify that inpatient services furnished can reasonably be expected to improve the patient's condition.      Starleen Blue, NP 07/02/2023, 2:25 PM Patient ID: Jake Morgan, male   DOB: 05-Aug-1976, 47 y.o.   MRN: [id number]

## 2023-07-02 NOTE — Progress Notes (Signed)
   07/01/23 2200  Psych Admission Type (Psych Patients Only)  Admission Status Voluntary  Psychosocial Assessment  Patient Complaints Anxiety  Eye Contact Fair  Facial Expression Anxious  Affect Appropriate to circumstance  Speech Logical/coherent  Interaction Assertive  Motor Activity Rigidity  Appearance/Hygiene Unremarkable  Behavior Characteristics Appropriate to situation;Cooperative  Mood Pleasant  Thought Process  Coherency WDL  Content WDL  Delusions None reported or observed  Perception WDL  Hallucination None reported or observed  Judgment Poor  Confusion None  Danger to Self  Current suicidal ideation? Denies  Agreement Not to Harm Self Yes  Description of Agreement verbal  Danger to Others  Danger to Others None reported or observed

## 2023-07-02 NOTE — [facility] Group Notes (Signed)
Adult Psychoeducational Group Note  Date:  07/02/2023 Time:  9:03 PM  Group Topic/Focus:  Wrap-Up Group:   The focus of this group is to help patients review their daily goal of treatment and discuss progress on daily workbooks.  Participation Level:  Active  Participation Quality:  Appropriate  Affect:  Appropriate  Cognitive:  Appropriate  Insight: Appropriate  Engagement in Group:  Engaged  Modes of Intervention:  Discussion  Additional Comments:  Powell said finally had a chance to go outside. Tayvon said he enjoyed the physical activity and it was a good time playing ball trees ground air. The positive he had a good visit with his wife. Positive thing he will be discharge tomorrow.  Charna Busman Long 07/02/2023, 9:03 PM

## 2023-07-02 NOTE — Plan of Care (Signed)
  Problem: Education: Goal: Knowledge of [facility] Education information/materials will improve Outcome: Progressing Goal: Emotional status will improve Outcome: Progressing Goal: Mental status will improve Outcome: Progressing Goal: Verbalization of understanding the information provided will improve Outcome: Progressing   Problem: Activity: Goal: Interest or engagement in activities will improve Outcome: Progressing   

## 2023-07-02 NOTE — Progress Notes (Signed)
   07/02/23 1900  Psych Admission Type (Psych Patients Only)  Admission Status Voluntary  Psychosocial Assessment  Patient Complaints Anxiety  Eye Contact Fair  Facial Expression Animated  Affect Appropriate to circumstance  Speech Logical/coherent  Interaction Assertive  Motor Activity Fidgety  Appearance/Hygiene Unremarkable  Behavior Characteristics Cooperative;Appropriate to situation  Mood Pleasant  Thought Process  Coherency WDL  Content WDL  Delusions None reported or observed  Perception WDL  Hallucination None reported or observed  Judgment Poor  Confusion None  Danger to Self  Current suicidal ideation? Denies  Agreement Not to Harm Self Yes  Danger to Others  Danger to Others None reported or observed

## 2023-07-02 NOTE — Plan of Care (Signed)
  Problem: Education: Goal: Knowledge of [facility] Education information/materials will improve Outcome: Progressing   Problem: Activity: Goal: Interest or engagement in activities will improve Outcome: Progressing   Problem: Coping: Goal: Ability to verbalize frustrations and anger appropriately will improve Outcome: Progressing   

## 2023-07-02 NOTE — Group Note (Signed)
Date:  07/02/2023 Time:  4:37 PM  Group Topic/Focus:  Goals Group:   The focus of this group is to help patients establish daily goals to achieve during treatment and discuss how the patient can incorporate goal setting into their daily lives to aide in recovery. Orientation:   The focus of this group is to educate the patient on the purpose and policies of crisis stabilization and provide a format to answer questions about their admission.  The group details unit policies and expectations of patients while admitted.    Participation Level:  Active  Participation Quality:  Attentive  Affect:  Appropriate  Cognitive:  Appropriate  Insight: Appropriate  Engagement in Group:  Engaged  Modes of Intervention:  Activity, Orientation, and Rapport Building  Additional Comments:   Pt attended and participated in the Orientation/Goals group. Pt goal for today is to workout and to stay positive.  Jake Morgan 07/02/2023, 4:37 PM

## 2023-07-02 NOTE — Progress Notes (Signed)
   07/02/23 2103  Psych Admission Type (Psych Patients Only)  Admission Status Voluntary  Psychosocial Assessment  Patient Complaints None  Eye Contact Fair  Facial Expression Anxious  Affect Appropriate to circumstance  Speech Logical/coherent  Interaction Assertive  Motor Activity Rigidity  Appearance/Hygiene Unremarkable  Behavior Characteristics Cooperative;Appropriate to situation  Mood Pleasant  Thought Process  Coherency WDL  Content WDL  Delusions None reported or observed  Perception WDL  Hallucination None reported or observed  Judgment Poor  Confusion None  Danger to Self  Current suicidal ideation? Denies  Agreement Not to Harm Self Yes  Description of Agreement verbal  Danger to Others  Danger to Others None reported or observed

## 2023-07-03 DIAGNOSIS — F332 Major depressive disorder, recurrent severe without psychotic features: Secondary | ICD-10-CM | POA: Diagnosis not present

## 2023-07-03 MED ORDER — GABAPENTIN 300 MG PO CAPS: 300.0000 mg | ORAL_CAPSULE | Freq: Three times a day (TID) | ORAL | 0 refills | Status: AC

## 2023-07-03 MED ORDER — SERTRALINE HCL 100 MG PO TABS: 100.0000 mg | ORAL_TABLET | Freq: Every day | ORAL | 0 refills | Status: AC

## 2023-07-03 MED ORDER — NICOTINE POLACRILEX 2 MG MT GUM: 2.0000 mg | CHEWING_GUM | OROMUCOSAL | 0 refills | Status: AC | PRN

## 2023-07-03 MED ORDER — TRAZODONE HCL 50 MG PO TABS: 50.0000 mg | ORAL_TABLET | Freq: Every evening | ORAL | 0 refills | Status: AC | PRN

## 2023-07-03 MED ORDER — HYDROXYZINE HCL 25 MG PO TABS: 25.0000 mg | ORAL_TABLET | Freq: Three times a day (TID) | ORAL | 0 refills | Status: AC | PRN

## 2023-07-03 NOTE — Plan of Care (Signed)
  Problem: Education: Goal: Knowledge of [facility] Education information/materials will improve Outcome: Progressing   Problem: Activity: Goal: Interest or engagement in activities will improve Outcome: Progressing   Problem: Coping: Goal: Ability to verbalize frustrations and anger appropriately will improve Outcome: Progressing   Problem: Health Behavior/Discharge Planning: Goal: Identification of resources available to assist in meeting health care needs will improve Outcome: Progressing

## 2023-07-03 NOTE — [facility] Group Notes (Signed)
Spiritual care group facilitated by Chaplain Dyanne Carrel, B[facility] and Arlyce Dice, Mdiv  Group focused on topic of strength. Group members reflected on what thoughts and feelings emerge when they hear this topic. They then engaged in facilitated dialog around how strength is present in their lives. This dialog focused on representing what strength had been to them in their lives (images and patterns given) and what they saw as helpful in their life now (what they needed / wanted).  Activity drew on narrative framework.  Patient Progress: Jake Morgan attended group and actively engaged and participated in group conversation and activities.  Comments demonstrated good insight and he was supportive of peers.

## 2023-07-03 NOTE — Progress Notes (Signed)
Patient verbalizes readiness for discharge. All patient belongings returned to patient. Discharge instructions read and discussed with patient (appointments, medications, resources). Patient expressed gratitude for care provided. Patient discharged to lobby at 58 where wife was waiting.

## 2023-07-03 NOTE — Discharge Instructions (Signed)
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
## 2023-07-03 NOTE — Discharge Summary (Signed)
Physician Discharge Summary Note  Patient:  Jake Morgan is an 47 y.o., male MRN:  [id number] DOB:  1976-10-14 Patient phone:  [phone] (home)  Patient address:   [address] [place] [address],  Total Time spent with patient: 45 minutes  Date of Admission:  06/29/2023 Date of Discharge: 07/03/2023  Reason for Admission:  Reason For Admission: Jake Morgan is a 47 yo CM with prior mental health diagnoses of MDD & PTSD who presented to the [facility] on 8/6 with worsening depressive symptoms, PTSD type symptoms & passive SI with no plan. Pt was transferred voluntarily to this hospital on 8/7 for treatment and stabilization of his mental status.   Principal Problem: MDD (major depressive disorder), recurrent severe, without psychosis (HCC) Discharge Diagnoses: Principal Problem:   MDD (major depressive disorder), recurrent severe, without psychosis (HCC) Active Problems:   PTSD (post-traumatic stress disorder)   GAD (generalized anxiety disorder)   Nicotine use disorder   Tetrahydrocannabinol (THC) use disorder, mild, abuse  Past Psychiatric History: See H & P  Past Medical History:  Past Medical History:  Diagnosis Date   COPD (chronic obstructive pulmonary disease) (HCC)    PTSD (post-traumatic stress disorder)    was in the service   Spontaneous pneumothorax 02/19/2005    Past Surgical History:  Procedure Laterality Date   APPENDECTOMY  2014   ORIF ANKLE FRACTURE Left 11/04/2021   Procedure: OPEN REDUCTION TRIMALLEOLAR ANKLE FRACTURE WITH POSTERIOR FIXATION, SYNDESMOSIS;  Surgeon: Terance Hart, MD;  Location: [facility] OR;  Service: Orthopedics;  Laterality: Left;   Family History: History reviewed. No pertinent family history. Family Psychiatric  History: See H & P Social History:  Social History   Substance and Sexual Activity  Alcohol Use Yes   Alcohol/week: 2.0 standard drinks of alcohol   Types: 2 Cans of beer per week   Comment: 3 times a week      Social History   Substance and Sexual Activity  Drug Use Never    Social History   Socioeconomic History   Marital status: Married    Spouse name: Not on file   Number of children: Not on file   Years of education: Not on file   Highest education level: Not on file  Occupational History   Not on file  Tobacco Use   Smoking status: Former    Types: Cigarettes   Smokeless tobacco: Never  Vaping Use   Vaping status: Every Day   Substances: Nicotine  Substance and Sexual Activity   Alcohol use: Yes    Alcohol/week: 2.0 standard drinks of alcohol    Types: 2 Cans of beer per week    Comment: 3 times a week   Drug use: Never   Sexual activity: Yes  Other Topics Concern   Not on file  Social History Narrative   Not on file   Social Determinants of Health   Financial Resource Strain: Not on file  Food Insecurity: Patient Declined (06/29/2023)   Hunger Vital Sign    Worried About Running Out of Food in the Last Year: Patient declined    Ran Out of Food in the Last Year: Patient declined  Transportation Needs: Patient Declined (06/29/2023)   PRAPARE - Administrator, Civil Service (Medical): Patient declined    Lack of Transportation (Non-Medical): Patient declined  Physical Activity: Not on file  Stress: Not on file  Social Connections: Not on file   Hospital Course:  See H &  P  Physical Findings: AIMS: 0 CIWA:  n/a COWS:  n/a  Musculoskeletal: Strength & Muscle Tone: within normal limits Gait & Station: normal Patient leans: N/A  Psychiatric Specialty Exam:  Presentation  General Appearance:  Appropriate for Environment; Fairly Groomed  Eye Contact: Good  Speech: Clear and Coherent  Speech Volume: Normal  Handedness: Right   Mood and Affect  Mood: Euthymic  Affect: Appropriate; Congruent   Thought Process  Thought Processes: Coherent  Descriptions of Associations:Intact  Orientation:Full (Time, Place and  Person)  Thought Content:Logical  History of Schizophrenia/Schizoaffective disorder:No data recorded Duration of Psychotic Symptoms:No data recorded Hallucinations:Hallucinations: None  Ideas of Reference:None  Suicidal Thoughts:Suicidal Thoughts: No  Homicidal Thoughts:Homicidal Thoughts: No   Sensorium  Memory: Immediate Good  Judgment: Good  Insight: Good   Executive Functions  Concentration: Good  Attention Span: Good  Recall: Good  Fund of Knowledge: Good  Language: Good   Psychomotor Activity  Psychomotor Activity: Psychomotor Activity: Normal   Assets  Assets: Communication Skills   Sleep  Sleep: Sleep: Good    Physical Exam: Physical Exam Review of Systems  Constitutional:  Negative for fever.  HENT: Negative.    Eyes:  Negative for blurred vision.  Respiratory:  Negative for cough.   Cardiovascular:  Negative for chest pain.  Gastrointestinal:  Negative for heartburn.  Genitourinary:  Negative for dysuria.  Musculoskeletal:  Negative for myalgias.  Skin: Negative.   Neurological:  Negative for dizziness.  Psychiatric/Behavioral:  Positive for depression (Denies SI/HI, denies intent or plan to harm self or any one else outside of [place]) and substance abuse (Educated on the negative impact of susbtance use on his mental health and verbalizes understanding). Negative for hallucinations, memory loss and suicidal ideas. The patient is nervous/anxious (resolving) and has insomnia (Resolving).    Blood pressure 103/77, pulse 88, temperature 98.8 F (37.1 C), temperature source Oral, resp. rate 17, height 6\' 1"  (1.854 m), weight 65.1 kg, SpO2 99%. Body mass index is 18.95 kg/m.   Social History   Tobacco Use  Smoking Status Former   Types: Cigarettes  Smokeless Tobacco Never   Tobacco Cessation:  A prescription for an FDA-approved tobacco cessation medication provided at discharge   Blood Alcohol level:  Lab Results   Component Value Date   ETH <10 06/27/2023   ETH <10 10/24/2021    Metabolic Disorder Labs:  Lab Results  Component Value Date   HGBA1C 5.0 07/01/2023   MPG 96.8 07/01/2023   No results found for: "PROLACTIN" Lab Results  Component Value Date   CHOL 174 07/01/2023   TRIG 85 07/01/2023   HDL 54 07/01/2023   CHOLHDL 3.2 07/01/2023   VLDL 17 07/01/2023   LDLCALC 103 (H) 07/01/2023    See Psychiatric Specialty Exam and Suicide Risk Assessment completed by Attending Physician prior to discharge.  Discharge destination:  Home  Is patient on multiple antipsychotic therapies at discharge:  No   Has Patient had three or more failed trials of antipsychotic monotherapy by history:  No  Recommended Plan for Multiple Antipsychotic Therapies: NA  Discharge Instructions     Diet - low sodium heart healthy   Complete by: As directed    Increase activity slowly   Complete by: As directed       Allergies as of 07/03/2023   No Known Allergies      Medication List     STOP taking these medications    buPROPion 150 MG 24 hr tablet Commonly  known as: WELLBUTRIN XL   mirtazapine 15 MG tablet Commonly known as: REMERON       TAKE these medications      Indication  gabapentin 300 MG capsule Commonly known as: NEURONTIN Take 1 capsule (300 mg total) by mouth 3 (three) times daily.  Indication: Generalized Anxiety Disorder   hydrOXYzine 25 MG tablet Commonly known as: ATARAX Take 1 tablet (25 mg total) by mouth 3 (three) times daily as needed for anxiety.  Indication: Feeling Anxious   Multivitamin Gummies Adult Chew Chew 2 capsules by mouth daily.  Indication: vitamin   nicotine polacrilex 2 MG gum Commonly known as: NICORETTE Take 1 each (2 mg total) by mouth every hour as needed for smoking cessation.  Indication: Nicotine Addiction   sertraline 100 MG tablet Commonly known as: ZOLOFT Take 1 tablet (100 mg total) by mouth daily. Start taking on: July 04, 2023  Indication: Generalized Anxiety Disorder, Major Depressive Disorder, Posttraumatic Stress Disorder   sildenafil 100 MG tablet Commonly known as: VIAGRA Take 50-100 mg by mouth daily as needed for erectile dysfunction.  Indication: Erectile Dysfunction   traZODone 50 MG tablet Commonly known as: DESYREL Take 1 tablet (50 mg total) by mouth at bedtime as needed for sleep.  Indication: Trouble Sleeping        Follow-up Information     [facility] Follow up on 07/04/2023.   Why: You have a nurse appointment on 07/04/23 at 11:00 am, Virtual via telephone.   You have a hospital discharge appointment on 07/11/23 at 1:30 pm to obtain therapy and medication management services, in person.   You also have an appointment on 07/26/23 at 3:00 pm. Contact information: [address] [facility] [place] [address] [phone]                Signed: Starleen Blue, NP 07/03/2023, 1:50 PM

## 2024-11-27 ENCOUNTER — Encounter: Payer: Self-pay | Admitting: Gastroenterology

## 2024-12-23 ENCOUNTER — Ambulatory Visit: Admitting: Gastroenterology
